# Patient Record
Sex: Female | Born: 1985 | Race: Black or African American | Hispanic: No | Marital: Married
Health system: Southern US, Community
[De-identification: ages and names within clinical notes are randomized; demographics above are authoritative.]

## PROBLEM LIST (undated history)

## (undated) DIAGNOSIS — D649 Anemia, unspecified: Secondary | ICD-10-CM

## (undated) DIAGNOSIS — D219 Benign neoplasm of connective and other soft tissue, unspecified: Secondary | ICD-10-CM

## (undated) DIAGNOSIS — J189 Pneumonia, unspecified organism: Secondary | ICD-10-CM

## (undated) DIAGNOSIS — B977 Papillomavirus as the cause of diseases classified elsewhere: Secondary | ICD-10-CM

## (undated) DIAGNOSIS — Z9889 Other specified postprocedural states: Secondary | ICD-10-CM

## (undated) DIAGNOSIS — R51 Headache: Secondary | ICD-10-CM

## (undated) DIAGNOSIS — L309 Dermatitis, unspecified: Secondary | ICD-10-CM

## (undated) DIAGNOSIS — R519 Headache, unspecified: Secondary | ICD-10-CM

## (undated) DIAGNOSIS — A599 Trichomoniasis, unspecified: Secondary | ICD-10-CM

## (undated) DIAGNOSIS — J45909 Unspecified asthma, uncomplicated: Secondary | ICD-10-CM

## (undated) DIAGNOSIS — N83209 Unspecified ovarian cyst, unspecified side: Secondary | ICD-10-CM

## (undated) HISTORY — DX: Headache: R51

## (undated) HISTORY — PX: LAPAROSCOPIC SALPINGO OOPHERECTOMY: SHX5927

## (undated) HISTORY — PX: DILATION AND CURETTAGE OF UTERUS: SHX78

## (undated) HISTORY — DX: Benign neoplasm of connective and other soft tissue, unspecified: D21.9

## (undated) HISTORY — PX: MYOMECTOMY: SHX85

## (undated) HISTORY — DX: Pneumonia, unspecified organism: J18.9

## (undated) HISTORY — DX: Headache, unspecified: R51.9

---
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
## 2003-05-13 ENCOUNTER — Encounter: Admission: RE | Admit: 2003-05-13 | Discharge: 2003-05-13 | Payer: Self-pay | Admitting: Family Medicine

## 2003-05-16 ENCOUNTER — Emergency Department (HOSPITAL_COMMUNITY): Admission: AD | Admit: 2003-05-16 | Discharge: 2003-05-17 | Payer: Self-pay | Admitting: Emergency Medicine

## 2004-04-15 ENCOUNTER — Emergency Department (HOSPITAL_COMMUNITY): Admission: EM | Admit: 2004-04-15 | Discharge: 2004-04-15 | Payer: Self-pay | Admitting: Family Medicine

## 2005-07-05 ENCOUNTER — Other Ambulatory Visit: Admission: RE | Admit: 2005-07-05 | Discharge: 2005-07-05 | Payer: Self-pay | Admitting: Family Medicine

## 2006-10-27 ENCOUNTER — Emergency Department (HOSPITAL_COMMUNITY): Admission: EM | Admit: 2006-10-27 | Discharge: 2006-10-27 | Payer: Self-pay | Admitting: Family Medicine

## 2009-06-10 ENCOUNTER — Other Ambulatory Visit: Admission: RE | Admit: 2009-06-10 | Discharge: 2009-06-10 | Payer: Self-pay | Admitting: Gynecology

## 2009-06-10 ENCOUNTER — Ambulatory Visit: Payer: Self-pay | Admitting: Women's Health

## 2011-01-15 LAB — STREP A DNA PROBE: Group A Strep Probe: NEGATIVE

## 2011-01-15 LAB — POCT RAPID STREP A: Streptococcus, Group A Screen (Direct): NEGATIVE

## 2012-07-01 ENCOUNTER — Inpatient Hospital Stay (HOSPITAL_COMMUNITY): Payer: BC Managed Care – PPO

## 2012-07-01 ENCOUNTER — Inpatient Hospital Stay (HOSPITAL_COMMUNITY)
Admission: AD | Admit: 2012-07-01 | Discharge: 2012-07-01 | Disposition: A | Discharge status: Home / Self Care | Payer: BC Managed Care – PPO | Source: Ambulatory Visit | Attending: Obstetrics & Gynecology | Admitting: Obstetrics & Gynecology

## 2012-07-01 ENCOUNTER — Other Ambulatory Visit (HOSPITAL_COMMUNITY)
Admission: RE | Admit: 2012-07-01 | Discharge: 2012-07-01 | Disposition: A | Discharge status: Home / Self Care | Payer: Self-pay | Source: Ambulatory Visit | Attending: Family Medicine | Admitting: Family Medicine

## 2012-07-01 ENCOUNTER — Emergency Department (INDEPENDENT_AMBULATORY_CARE_PROVIDER_SITE_OTHER)
Admission: EM | Admit: 2012-07-01 | Discharge: 2012-07-01 | Disposition: A | Discharge status: Home / Self Care | Payer: Self-pay | Source: Home / Self Care | Attending: Family Medicine | Admitting: Family Medicine

## 2012-07-01 ENCOUNTER — Encounter (HOSPITAL_COMMUNITY): Payer: Self-pay | Admitting: Emergency Medicine

## 2012-07-01 ENCOUNTER — Encounter (HOSPITAL_COMMUNITY): Payer: Self-pay

## 2012-07-01 DIAGNOSIS — R1900 Intra-abdominal and pelvic swelling, mass and lump, unspecified site: Secondary | ICD-10-CM | POA: Insufficient documentation

## 2012-07-01 DIAGNOSIS — N949 Unspecified condition associated with female genital organs and menstrual cycle: Secondary | ICD-10-CM | POA: Insufficient documentation

## 2012-07-01 DIAGNOSIS — R1903 Right lower quadrant abdominal swelling, mass and lump: Secondary | ICD-10-CM

## 2012-07-01 DIAGNOSIS — Z113 Encounter for screening for infections with a predominantly sexual mode of transmission: Secondary | ICD-10-CM | POA: Insufficient documentation

## 2012-07-01 DIAGNOSIS — R109 Unspecified abdominal pain: Secondary | ICD-10-CM | POA: Insufficient documentation

## 2012-07-01 DIAGNOSIS — N760 Acute vaginitis: Secondary | ICD-10-CM | POA: Insufficient documentation

## 2012-07-01 HISTORY — DX: Unspecified asthma, uncomplicated: J45.909

## 2012-07-01 HISTORY — DX: Unspecified ovarian cyst, unspecified side: N83.209

## 2012-07-01 HISTORY — DX: Benign neoplasm of connective and other soft tissue, unspecified: D21.9

## 2012-07-01 LAB — COMPREHENSIVE METABOLIC PANEL
ALT: 9 U/L (ref 0–35)
AST: 15 U/L (ref 0–37)
Albumin: 3.6 g/dL (ref 3.5–5.2)
Calcium: 9.7 mg/dL (ref 8.4–10.5)
Creatinine, Ser: 0.81 mg/dL (ref 0.50–1.10)
Sodium: 138 mEq/L (ref 135–145)
Total Protein: 7.7 g/dL (ref 6.0–8.3)

## 2012-07-01 LAB — POCT URINALYSIS DIP (DEVICE)
Bilirubin Urine: NEGATIVE
Glucose, UA: NEGATIVE mg/dL
Nitrite: NEGATIVE
Urobilinogen, UA: 0.2 mg/dL (ref 0.0–1.0)

## 2012-07-01 LAB — CBC
HCT: 34.9 % — ABNORMAL LOW (ref 36.0–46.0)
Hemoglobin: 11.8 g/dL — ABNORMAL LOW (ref 12.0–15.0)
MCH: 28.7 pg (ref 26.0–34.0)
MCV: 84.9 fL (ref 78.0–100.0)
RBC: 4.11 MIL/uL (ref 3.87–5.11)

## 2012-07-01 LAB — POCT PREGNANCY, URINE: Preg Test, Ur: NEGATIVE

## 2012-07-01 MED ORDER — SODIUM CHLORIDE 0.9 % IJ SOLN
INTRAMUSCULAR | Status: AC
  Filled 2012-07-01: qty 6

## 2012-07-01 MED ORDER — IOHEXOL 300 MG/ML  SOLN
50.0000 mL | INTRAMUSCULAR | Status: AC
  Administered 2012-07-01: 50 mL via ORAL

## 2012-07-01 MED ORDER — IOHEXOL 300 MG/ML  SOLN
100.0000 mL | Freq: Once | INTRAMUSCULAR | Status: AC | PRN
  Administered 2012-07-01: 100 mL via INTRAVENOUS

## 2012-07-01 NOTE — MAU Provider Note (Signed)
History     CSN: [id number]  Arrival date and time: 07/01/12 1617   First Provider Initiated Contact with Patient 07/01/12 1654      Chief Complaint  Patient presents with  . Pelvic Pain   HPI [name] is a 27 y.o. G2P0020 who was sent to MAU today from [facility] urgent care with complaint of abdominal pain and mass. The patient was seen at urgent care and had a pelvic exam with cultures. UPT was negative. She has a large tender mass across her lower abdomen. Patient denies vaginal bleeding, vaginal discharge, N/V or fever. She has had regular periods. LMP was 06/16/12. She denies abnormal weight loss, night sweats.   OB History   Grav Para Term Preterm Abortions TAB SAB Ect Mult Living   2    2 1 1          Past Medical History  Diagnosis Date  . Asthma     Past Surgical History  Procedure Laterality Date  . Dilation and curettage of uterus      History reviewed. No pertinent family history.  History  Substance Use Topics  . Smoking status: Never Smoker   . Smokeless tobacco: Not on file  . Alcohol Use: No    Allergies: No Known Allergies  No prescriptions prior to admission    Review of Systems  Constitutional: Negative for fever, chills, weight loss, malaise/fatigue and diaphoresis.  Gastrointestinal: Positive for abdominal pain. Negative for nausea and vomiting.  Genitourinary:       Neg - vaginal bleeding Neg - vaginal discharge  Neurological: Negative for dizziness, loss of consciousness and weakness.   Physical Exam   Blood pressure 120/72, pulse 84, temperature 98.5 F (36.9 C), temperature source Oral, resp. rate 16, height 5' (1.524 m), weight 131 lb 6 oz (59.591 kg), last menstrual period 06/16/2012.  Physical Exam  Constitutional: She is oriented to person, place, and time. She appears well-developed and well-nourished. No distress.  HENT:  Head: Normocephalic and atraumatic.  Cardiovascular: Normal rate, regular rhythm and normal heart  sounds.   Respiratory: Effort normal and breath sounds normal. No respiratory distress.  GI: Bowel sounds are normal. She exhibits distension and mass (Large firm mass palpable across the lower abdomen). There is tenderness (mild tenderness to palpation of the LLQ). There is no rebound and no guarding.  Neurological: She is alert and oriented to person, place, and time.  Skin: Skin is warm and dry. No erythema.  Psychiatric: She has a normal mood and affect.   Results for orders placed during the hospital encounter of 07/01/12 (from the past 24 hour(s))  POCT URINALYSIS DIP (DEVICE)     Status: None   Collection Time    07/01/12  2:47 PM      Result Value Range   Glucose, UA NEGATIVE  NEGATIVE mg/dL   Bilirubin Urine NEGATIVE  NEGATIVE   Ketones, ur NEGATIVE  NEGATIVE mg/dL   Specific Gravity, Urine 1.015  1.005 - 1.030   Hgb urine dipstick NEGATIVE  NEGATIVE   pH 8.0  5.0 - 8.0   Protein, ur NEGATIVE  NEGATIVE mg/dL   Urobilinogen, UA 0.2  0.0 - 1.0 mg/dL   Nitrite NEGATIVE  NEGATIVE   Leukocytes, UA NEGATIVE  NEGATIVE  POCT PREGNANCY, URINE     Status: None   Collection Time    07/01/12  2:58 PM      Result Value Range   Preg Test, Ur NEGATIVE  NEGATIVE  MAU Course  Procedures None Clinical Data: Pelvic mass  CT ABDOMEN AND PELVIS WITH CONTRAST  Technique: Multidetector CT imaging of the abdomen and pelvis was  performed following the standard protocol during bolus  administration of intravenous contrast.  Contrast: OMNIPAQUE IOHEXOL 300 MG/ML SOLN  Comparison: None.  Findings: The liver, spleen, pancreas, adrenal glands, kidneys are  within normal limits.  There is a large 9.7 x 15.1 x 12.7 cm complex solid and cystic mass  emanating from the right adnexa. There are no definite fatty  elements or calcified elements.  2.1 x 3.2 cm avidly enhancing and heterogeneous pedunculated  fibroid from the left side of the fundus is noted.  Irregular heterogeneous complex  cystic lesion within the left  adnexa measures 2.0 cm and likely represents an involuting  hemorrhagic cyst.  Trace free fluid is present in the pelvis.  No finding to suggest peritoneal carcinomatosis. There is no free  fluid in the abdomen.  There is no obvious abnormal adenopathy.  IMPRESSION:  Large complex solid and cystic mass emanating from the right adnexa  worrisome for ovarian neoplasm.  Pedunculated fundal fibroid.  Small complex cystic lesion in the left ovary as described.   MDM Discussed [place] results with Dr. Marice Potter. She would like CT scan today and CA-125. Patient will need to be referred to clinic for follow-up and surgical consult.   Assessment and Plan  2000 - Patient in Radiology. Care turned over to Nolene Bernheim, NP Assessment Right adnexal abdominal mass  Plan GYN clinic to call client and schedule follow up.  Message sent to clinic. Call the clinic if you do not get a call in 2 days. Freddi Starr, PA-C  07/01/2012, 5:02 PM

## 2012-07-01 NOTE — ED Provider Notes (Signed)
History     CSN: [id number]  Arrival date & time 07/01/12  1213   First MD Initiated Contact with Patient 07/01/12 1254      Chief Complaint  Patient presents with  . Abdominal Pain    (Consider location/radiation/quality/duration/timing/severity/associated sxs/prior treatment) Patient is a 27 y.o. female presenting with abdominal pain. The history is provided by the patient.  Abdominal Pain Pain location:  Suprapubic Pain quality: aching   Pain radiates to:  Does not radiate Pain severity:  Moderate Onset quality:  Gradual Timing:  Constant Progression:  Worsening Relieved by:  Nothing Worsened by:  Nothing tried   History reviewed. No pertinent past medical history.  History reviewed. No pertinent past surgical history.  History reviewed. No pertinent family history.  History  Substance Use Topics  . Smoking status: Never Smoker   . Smokeless tobacco: Not on file  . Alcohol Use: No    OB History   Grav Para Term Preterm Abortions TAB SAB Ect Mult Living                  Review of Systems  Gastrointestinal: Positive for abdominal pain.    Allergies  Review of patient's allergies indicates no known allergies.  Home Medications  No current outpatient prescriptions on file.  BP 109/60  Pulse 71  Temp(Src) 98.1 F (36.7 C) (Oral)  Resp 16  SpO2 100%  LMP 06/16/2012  Physical Exam  ED Course  Procedures (including critical care time)  Labs Reviewed  POCT URINALYSIS DIP (DEVICE)  POCT PREGNANCY, URINE  CERVICOVAGINAL ANCILLARY ONLY   No results found.   1. Pelvic mass       MDM  Pt sent to [facility] hospital for evaluation at MAU for pelvic mass        Elson Areas, PA-C 07/01/12 2011

## 2012-07-01 NOTE — ED Notes (Signed)
Pt c/o lower abdominal pain with a small knot in lower abdomin x 1 month. No n/v, diarrhea, constipation, urinary retention. Pt states she took a gas pill with mild relief. Pt reports it uncomfortable to touch that area., but not painful. Feels bloated. Patient is alert and oriented.

## 2012-07-01 NOTE — MAU Note (Signed)
Patient drove herself from [facility] for further evaluation. She went in with c/o pelvic pressure that intensifies with movement or touch. She denies vaginal bleeding or abnormal vaginal discharge. She denies any pain while sitting on stretcher. She is aware that her pregnancy test was negative.

## 2012-07-02 LAB — CA 125: CA 125: 14.4 U/mL (ref 0.0–30.2)

## 2012-07-03 ENCOUNTER — Telehealth (HOSPITAL_COMMUNITY): Payer: Self-pay | Admitting: *Deleted

## 2012-07-03 MED ORDER — METRONIDAZOLE 500 MG PO TABS: 500.0000 mg | ORAL_TABLET | Freq: Two times a day (BID) | ORAL | Status: DC

## 2012-07-03 NOTE — ED Notes (Signed)
GC/Chlamydia neg.,  Affirm: Gardnerella/Trich pos. Candida neg.  Pt. was sent to [facility] for evaluation of pelvic mass but not treatment from there noted.  Message sent to [employer] PA.

## 2012-07-03 NOTE — ED Notes (Signed)
Langston Masker PA e-prescribed Flagyl to [facility] on [address]. I called pt. and left a message to call. Vassie Moselle 07/03/2012

## 2012-07-03 NOTE — ED Provider Notes (Signed)
Medical screening examination/treatment/procedure(s) were performed by resident physician or non-physician practitioner and as supervising physician I was immediately available for consultation/collaboration.   Sicily Zaragoza DOUGLAS MD.   Curt Oatis D Rachele Lamaster, MD 07/03/12 1954 

## 2012-07-04 ENCOUNTER — Telehealth (HOSPITAL_COMMUNITY): Payer: Self-pay | Admitting: *Deleted

## 2012-07-04 NOTE — ED Notes (Signed)
Pt. called back.  Pt. verified x 2 and given results.  Pt. told she needs Flagyl for Gardnerella and Trich.  Pt. told it was sent to the [facility] on [address]   Pt. instructed to no alcohol while taking this medication.  You need to notify your partner to be treated with Flagyl, no sex until you have finished your medication and your partner has been treated and to practice safe sex. Pt. voiced understanding. Vassie Moselle 07/04/2012

## 2012-07-04 NOTE — ED Notes (Signed)
Pt's VM is full. Left message with pt.'s mother for pt. to call me. Dawn Mayo 07/04/2012

## 2012-07-24 ENCOUNTER — Encounter: Payer: Self-pay | Admitting: Family Medicine

## 2012-07-24 ENCOUNTER — Ambulatory Visit (INDEPENDENT_AMBULATORY_CARE_PROVIDER_SITE_OTHER): Payer: Self-pay | Admitting: Family Medicine

## 2012-07-24 VITALS — BP 137/102 | HR 102 | Temp 98.9°F | Ht 60.0 in | Wt 131.9 lb

## 2012-07-24 DIAGNOSIS — N83209 Unspecified ovarian cyst, unspecified side: Secondary | ICD-10-CM

## 2012-07-24 NOTE — Patient Instructions (Addendum)
Ovarian Cyst The ovaries are small organs that are on each side of the uterus. The ovaries are the organs that produce the female hormones, estrogen and progesterone. An ovarian cyst is a sac filled with fluid that can vary in its size. It is normal for a small cyst to form in women who are in the childbearing age and who have menstrual periods. This type of cyst is called a follicle cyst that becomes an ovulation cyst (corpus luteum cyst) after it produces the women's egg. It later goes away on its own if the woman does not become pregnant. There are other kinds of ovarian cysts that may cause problems and may need to be treated. The most serious problem is a cyst with cancer. It should be noted that menopausal women who have an ovarian cyst are at a higher risk of it being a cancer cyst. They should be evaluated very quickly, thoroughly and followed closely. This is especially true in menopausal women because of the high rate of ovarian cancer in women in menopause. CAUSES AND TYPES OF OVARIAN CYSTS:  FUNCTIONAL CYST: The follicle/corpus luteum cyst is a functional cyst that occurs every month during ovulation with the menstrual cycle. They go away with the next menstrual cycle if the woman does not get pregnant. Usually, there are no symptoms with a functional cyst.  ENDOMETRIOMA CYST: This cyst develops from the lining of the uterus tissue. This cyst gets in or on the ovary. It grows every month from the bleeding during the menstrual period. It is also called a "chocolate cyst" because it becomes filled with blood that turns brown. This cyst can cause pain in the lower abdomen during intercourse and with your menstrual period.  CYSTADENOMA CYST: This cyst develops from the cells on the outside of the ovary. They usually are not cancerous. They can get very big and cause lower abdomen pain and pain with intercourse. This type of cyst can twist on itself, cut off its blood supply and cause severe pain. It  also can easily rupture and cause a lot of pain.  DERMOID CYST: This type of cyst is sometimes found in both ovaries. They are found to have different kinds of body tissue in the cyst. The tissue includes skin, teeth, hair, and/or cartilage. They usually do not have symptoms unless they get very big. Dermoid cysts are rarely cancerous.  POLYCYSTIC OVARY: This is a rare condition with hormone problems that produces many small cysts on both ovaries. The cysts are follicle-like cysts that never produce an egg and become a corpus luteum. It can cause an increase in body weight, infertility, acne, increase in body and facial hair and lack of menstrual periods or rare menstrual periods. Many women with this problem develop type 2 diabetes. The exact cause of this problem is unknown. A polycystic ovary is rarely cancerous.  THECA LUTEIN CYST: Occurs when too much hormone (human chorionic gonadotropin) is produced and over-stimulates the ovaries to produce an egg. They are frequently seen when doctors stimulate the ovaries for invitro-fertilization (test tube babies).  LUTEOMA CYST: This cyst is seen during pregnancy. Rarely it can cause an obstruction to the birth canal during labor and delivery. They usually go away after delivery. SYMPTOMS   Pelvic pain or pressure.  Pain during sexual intercourse.  Increasing girth (swelling) of the abdomen.  Abnormal menstrual periods.  Increasing pain with menstrual periods.  You stop having menstrual periods and you are not pregnant. DIAGNOSIS  The diagnosis can   be made during:  Routine or annual pelvic examination (common).  Ultrasound.  X-ray of the pelvis.  CT Scan.  MRI.  Blood tests. TREATMENT   Treatment may only be to follow the cyst monthly for 2 to 3 months with your caregiver. Many go away on their own, especially functional cysts.  May be aspirated (drained) with a long needle with ultrasound, or by laparoscopy (inserting a tube into  the pelvis through a small incision).  The whole cyst can be removed by laparoscopy.  Sometimes the cyst may need to be removed through an incision in the lower abdomen.  Hormone treatment is sometimes used to help dissolve certain cysts.  Birth control pills are sometimes used to help dissolve certain cysts. HOME CARE INSTRUCTIONS  Follow your caregiver's advice regarding:  Medicine.  Follow up visits to evaluate and treat the cyst.  You may need to come back or make an appointment with another caregiver, to find the exact cause of your cyst, if your caregiver is not a gynecologist.  Get your yearly and recommended pelvic examinations and Pap tests.  Let your caregiver know if you have had an ovarian cyst in the past. SEEK MEDICAL CARE IF:   Your periods are late, irregular, they stop, or are painful.  Your stomach (abdomen) or pelvic pain does not go away.  Your stomach becomes larger or swollen.  You have pressure on your bladder or trouble emptying your bladder completely.  You have painful sexual intercourse.  You have feelings of fullness, pressure, or discomfort in your stomach.  You lose weight for no apparent reason.  You feel generally ill.  You become constipated.  You lose your appetite.  You develop acne.  You have an increase in body and facial hair.  You are gaining weight, without changing your exercise and eating habits.  You think you are pregnant. SEEK IMMEDIATE MEDICAL CARE IF:   You have increasing abdominal pain.  You feel sick to your stomach (nausea) and/or vomit.  You develop a fever that comes on suddenly.  You develop abdominal pain during a bowel movement.  Your menstrual periods become heavier than usual. Document Released: 03/19/2005 Document Revised: 06/11/2011 Document Reviewed: 01/20/2009 Exit[employer] Patient Information 2013 [employer].  

## 2012-07-24 NOTE — Progress Notes (Signed)
  Subjective:    Patient ID: Dawn Mayo, female    DOB: Feb 17, 1986, 27 y.o.   MRN: [id number]  HPI  Ptl is new today.  [id number] with 4 month h/o hard growth in stomach.  Pt. Noted enlarging area in December.  She has noted increased growth, increasing girth, and discomfort.  She was seen in urgent care and ED and found to have a large ovarian mass on right measuring 11.5 x 9.0 x 10.4.  The left ovary also has a similar appearance but a smaller lesion by both Ct and pelvic sono.  She has begun to have pain, especially on the left.  She was noted to have bilateral pelvic masses but the largest is on the right. Her CA-125 was negative.  X-rays were reviewed with Radiology today and they state that images are very concerning for malignancy.  Past Medical History  Diagnosis Date  . Asthma   . Fibroids   . Ovarian cyst    Past Surgical History  Procedure Laterality Date  . Dilation and curettage of uterus     Current Outpatient Prescriptions on File Prior to Visit  Medication Sig Dispense Refill  . metroNIDAZOLE (FLAGYL) 500 MG tablet Take 1 tablet (500 mg total) by mouth 2 (two) times daily.  14 tablet  0   No current facility-administered medications on file prior to visit.   No Known Allergies History reviewed. No pertinent family history. History   Social History  . Marital Status: Single    Spouse Name: N/A    Number of Children: N/A  . Years of Education: N/A   Occupational History  . Not on file.   Social History Main Topics  . Smoking status: Never Smoker   . Smokeless tobacco: Not on file  . Alcohol Use: No  . Drug Use: No  . Sexually Active: Not Currently    Birth Control/ Protection: None   Other Topics Concern  . Not on file   Social History Narrative  . No narrative on file    Review of Systems  Constitutional: Positive for fatigue and unexpected weight change (weight gain). Negative for fever and chills.  Respiratory: Negative for cough and shortness of  breath.   Cardiovascular: Negative for palpitations and leg swelling.  Gastrointestinal: Positive for abdominal pain. Negative for nausea and vomiting.       + bloating.  Endocrine: Negative for polyuria.  Genitourinary: Negative for vaginal bleeding and menstrual problem.  Musculoskeletal: Negative for arthralgias.  Skin: Negative for rash.  Neurological: Negative for headaches.  Psychiatric/Behavioral: Negative for confusion.       Objective:   Physical Exam  Vitals reviewed. Constitutional: She appears well-developed and well-nourished.  HENT:  Head: Normocephalic and atraumatic.  Eyes: No scleral icterus.  Cardiovascular: Normal rate and regular rhythm.   Pulmonary/Chest: Effort normal and breath sounds normal.  Abdominal: She exhibits distension and mass (lower abdomen measuring 25 x 22 cm.). There is tenderness.          Assessment & Plan:

## 2012-07-25 NOTE — Assessment & Plan Note (Addendum)
Features are worrisome for malignancy, given size, rapidity of growth, review with radiology and bilaterality.  Given all of this, feel pt. Would best be served seeing GYN/Oncology.  I have contacted them and made an appointment for her on April 29,2014 at 2:30.

## 2012-07-28 ENCOUNTER — Telehealth: Payer: Self-pay | Admitting: Gynecologic Oncology

## 2012-07-28 NOTE — Telephone Encounter (Signed)
Consult Note: Gyn-Onc  Consult was requested by Dr. Marland Kitchen for the evaluation of Dawn Mayo 27 y.o. female  CC: Pelvic mass   Assessment/Plan:  [name]  is a 27 y.o.  year old  With a 15.1 cm complex right adnexal mass and normal CA125    HPI:   07/01/12 CT Abdomen and pelvis Findings: The liver, spleen, pancreas, adrenal glands, kidneys are within normal limits. There is a large 9.7 x 15.1 x 12.7 cm complex solid and cystic mass emanating from the right adnexa. There are no definite fatty elements or calcified elements. 2.1 x 3.2 cm avidly enhancing and heterogeneous pedunculated fibroid from the left side of the fundus is noted. Irregular heterogeneous complex cystic lesion within the left adnexa measures 2.0 cm and likely represents an involuting hemorrhagic cyst. Trace free fluid is present in the pelvis.  07/01/12 Pelvic ultrasound The right complex adnexal mass demonstrates a solid component measuring 5.0 x 5.0 by 5.2 cm which contains some internal flow with color Doppler exam as well as a slightly thickened septation measuring 3.5 mm. More normal appearing ovarian tissue is seen at  this periphery of this lesion (images 84 and 85) suggesting this is ovarian in etiology. This could represent a dermoid but the appearance is not typical and the solid component would be considered a worrisome feature. A malignant neoplastic process such as a serous cystadenocarcinoma or borderline tumor cannot be excluded.  Lab Results  Component Value Date   CA125 14.4 07/01/2012     Current Meds:  Outpatient Encounter Prescriptions as of 07/28/2012  Medication Sig Dispense Refill  . metroNIDAZOLE (FLAGYL) 500 MG tablet Take 1 tablet (500 mg total) by mouth 2 (two) times daily.  14 tablet  0   No facility-administered encounter medications on file as of 07/28/2012.    Allergy: No Known Allergies  Social Hx:   History   Social History  . Marital Status: Single    Spouse Name: N/A     Number of Children: N/A  . Years of Education: N/A   Occupational History  . Not on file.   Social History Main Topics  . Smoking status: Never Smoker   . Smokeless tobacco: Not on file  . Alcohol Use: No  . Drug Use: No  . Sexually Active: Not Currently    Birth Control/ Protection: None   Other Topics Concern  . Not on file   Social History Narrative  . No narrative on file    Past Surgical Hx:  Past Surgical History  Procedure Laterality Date  . Dilation and curettage of uterus      Past Medical Hx:  Past Medical History  Diagnosis Date  . Asthma   . Fibroids   . Ovarian cyst     Past Gynecological History:  ** Patient's last menstrual period was 07/11/2012.  Family Hx: No family history on file.  Review of Systems:  Constitutional  Feels well,  ** Skin/Breast  No rash, sores, jaundice, itching, dryness Cardiovascular  No chest pain, shortness of breath, or edema  Pulmonary  No cough or wheeze.  Gastro Intestinal  No nausea, vomitting, or diarrhoea. No bright red blood per rectum, no abdominal pain, change in bowel movement, or constipation.  Genito Urinary  No frequency, urgency, dysuria, ** Musculo Skeletal  No myalgia, arthralgia, joint swelling or pain  Neurologic  No weakness, numbness, change in gait,  Psychology  No depression, anxiety, insomnia.   Vitals:  @v @  Physical  Exam: WD in NAD Neck  Supple NROM, without any enlargements.  Lymph Node Survey No cervical supraclavicular or inguinal adenopathy Cardiovascular  Pulse normal rate, regularity and rhythm. S1 and S2 normal.  Lungs  Clear to auscultation bilateraly, without wheezes/crackles/rhonchi. Good air movement.  Skin  No rash/lesions/breakdown  Psychiatry  Alert and oriented to person, place, and time  Abdomen  Normoactive bowel sounds, abdomen soft, non-tender and obese. Surgical  sites intact without evidence of hernia.  Back No CVA tenderness Genito Urinary   Vulva/vagina: Normal external female genitalia.  No lesions. No discharge or bleeding.  Bladder/urethra:  No lesions or masses  Vagina: **  Cervix: Normal appearing, no lesions.  Uterus: Small, mobile, no parametrial involvement or nodularity.  Adnexa: No palpable masses. Rectal  Good tone, no masses no cul de sac nodularity.  Extremities  No bilateral cyanosis, clubbing or edema.   [name], MD, PhD 07/28/2012, 9:51 PM

## 2012-07-29 ENCOUNTER — Encounter: Payer: Self-pay | Admitting: Gynecologic Oncology

## 2012-07-29 ENCOUNTER — Ambulatory Visit: Payer: Self-pay | Attending: Gynecologic Oncology | Admitting: Gynecologic Oncology

## 2012-07-29 VITALS — BP 100/64 | HR 64 | Temp 98.3°F | Resp 14 | Ht 61.46 in | Wt 130.6 lb

## 2012-07-29 DIAGNOSIS — N838 Other noninflammatory disorders of ovary, fallopian tube and broad ligament: Secondary | ICD-10-CM

## 2012-07-29 DIAGNOSIS — N9489 Other specified conditions associated with female genital organs and menstrual cycle: Secondary | ICD-10-CM | POA: Insufficient documentation

## 2012-07-29 NOTE — Patient Instructions (Addendum)
I have reviewed my recommendation for the planned surgery of Exploratory laparotomy, bilateral ovarian cystectomy.  If malignancy is encountered the procedure could be extended to total abdominal hysterectomy, bilateral salpingo-oopherectomy, omentectomy, lymph node dissection, tumor debulking, possible appendectomy, possible bowel resection, possible colostomy. Risks of infection, bleeding, damage to nearby organs including bowel, bladder, vessels, and ureters reviewed.   We understand that ovarian preservation and fertility  is a priority. We will contact you regarding a surgery date.    Thank you very much Dawn Mayo for allowing me to provide care for you today.  I appreciate your confidence in choosing our Gynecologic Oncology team.  If you have any questions about your visit today please call our office and we will get back to you as soon as possible.  Maryclare Labrador. Mysty Kielty MD., PhD Gynecologic Oncology

## 2012-07-29 NOTE — Progress Notes (Signed)
Consult Note: Gyn-Onc  Consult was requested by Dr. Pratt for the evaluation of Dawn Mayo 27 y.o. female  CC: Pelvic mass   Assessment/Plan:  Dawn Mayo  is a 27 y.o.  year old  with a 15.1 cm complex right adnexal mass and normal CA125.  The left adnexa suspicious for an abnormal appearing ovarian cysts. [name] strongly desires to follow consult speech and her PT any with her yet unborn children.  That is she strongly desires preservation of fertility.   She is aware that the index of suspicion is low that this is a malignancy. I've also counseled her that there are ovarian cancers for which ovarian preservation if possible.  In the event that ovarian preservation is not possible she would strongly desire uterine preservation.  She was counseled of the operative procedure most likely would be bilateral ovarian cystectomy possible omentectomy lymph node dissection debulking or other indicated procedures. The risks discussed were inclusive of infection bleeding damage to surrounding structures prolonged hospitalization and reoperation.  We will followup with [name] regarding the surgery date. She is aware that the surgeon will be Dr. Paula Gehrig. I've also asked her to substitute the metal periclitoral ring for a plastic device.   HPI:   07/01/12 CT Abdomen and pelvis Findings: The liver, spleen, pancreas, adrenal glands, kidneys are within normal limits. There is a large 9.7 x 15.1 x 12.7 cm complex solid and cystic mass emanating from the right adnexa. There are no definite fatty elements or calcified elements. 2.1 x 3.2 cm avidly enhancing and heterogeneous pedunculated fibroid from the left side of the fundus is noted. Irregular heterogeneous complex cystic lesion within the left adnexa measures 2.0 cm and likely represents an involuting hemorrhagic cyst. Trace free fluid is present in the pelvis.  07/01/12 Pelvic ultrasound The right complex adnexal mass demonstrates a  solid component measuring 5.0 x 5.0 by 5.2 cm which contains some internal flow with color Doppler exam as well as a slightly thickened septation measuring 3.5 mm. More normal appearing ovarian tissue is seen at  this periphery of this lesion  suggesting this is ovarian in etiology. This could represent a dermoid but the appearance is not typical and the solid component would be considered a worrisome feature. A malignant neoplastic process such as a serous cystadenocarcinoma or borderline tumor cannot be excluded.  Reports weight gain and increasing abdominal girth.  No change in bowel or bladder habits.      Lab Results  Component Value Date   CA125 14.4 07/01/2012     Current Meds:  Outpatient Encounter Prescriptions as of 07/29/2012  Medication Sig Dispense Refill  . metroNIDAZOLE (FLAGYL) 500 MG tablet Take 1 tablet (500 mg total) by mouth 2 (two) times daily.  14 tablet  0   No facility-administered encounter medications on file as of 07/29/2012.    Allergy: No Known Allergies  Social Hx:   History   Social History  . Marital Status: Single    Spouse Name: N/A    Number of Children: N/A  . Years of Education: N/A   Occupational History  . Not on file.   Social History Main Topics  . Smoking status: Never Smoker   . Smokeless tobacco: Not on file  . Alcohol Use: No  . Drug Use: No  . Sexually Active: Not Currently    Birth Control/ Protection: None   Other Topics Concern  . Not on file   Social History Narrative  . No   narrative on file  [name]  is pending [facility]. Final examinations are scheduled for next week.  Her appointment is that presently of a hairstylist.  Past Surgical Hx:  Past Surgical History  Procedure Laterality Date  . Dilation and curettage of uterus      Past Medical Hx:  Past Medical History  Diagnosis Date  . Asthma   . Fibroids   . Ovarian cyst     Past Gynecological History:  G2P0.  Menarche 11 regular menses.  Patient's last menstrual period was 07/11/2012.  Family Hx: No family history on file.  Review of Systems:  Constitutional  Feels well,  Reports weight gain. Cardiovascular  No chest pain, shortness of breath, or edema  Pulmonary  No cough or wheeze.  Gastro Intestinal  No nausea, vomitting, or diarrhoea. No bright red blood per rectum, infrequent abdominal pain, no  change in bowel movement, or constipation. Reports increasing abdominal girth. Genito Urinary  No frequency, urgency, dysuria, no vaginal bleeding or discharge Musculo Skeletal  No myalgia, arthralgia, joint swelling or pain  Neurologic  No weakness, numbness, change in gait,  Psychology  No depression, anxiety, insomnia.   Vitals:  BP 100/64  Pulse 64  Temp(Src) 98.3 F (36.8 C) (Oral)  Resp 14  Ht 5' 1.46" (1.561 m)  Wt 130 lb 9.6 oz (59.24 kg)  BMI 24.31 kg/m2  LMP 07/11/2012  Physical Exam: WD in NAD Neck  Supple NROM, without any enlargements.  Lymph Node Survey No cervical supraclavicular or inguinal adenopathy Cardiovascular  Pulse normal rate, regularity and rhythm. S1 and S2 normal.  Lungs  Clear to auscultation bilateraly, without wheezes/crackles/rhonchi. Good air movement.  Psychiatry  Alert and oriented to person, place, and time  Abdomen  Normoactive bowel sounds, abdomen soft, non-tender mobile pelvic mass.  Back No CVA tenderness Genito Urinary  Vulva/vagina: Normal external female genitalia. Periclitoral metal ring  No lesions. No discharge or bleeding.  Bladder/urethra:  No lesions or masses  Vagina: Well estrogenized Cervix: Normal appearing, no lesions.  Uterus: Small, mobile, no parametrial involvement or nodularity.  Adnexa: large mobile pelvic mass.  No cul de sac nodularity Rectal  Good tone, no masses no cul de sac nodularity.  Extremities  No bilateral cyanosis, clubbing or edema.   Adel Neyer, MD, PhD 07/29/2012, 3:14 PM   

## 2012-08-07 ENCOUNTER — Encounter (HOSPITAL_COMMUNITY): Payer: Self-pay | Admitting: Pharmacy Technician

## 2012-08-14 ENCOUNTER — Other Ambulatory Visit (HOSPITAL_COMMUNITY): Payer: Self-pay | Admitting: *Deleted

## 2012-08-15 ENCOUNTER — Encounter (HOSPITAL_COMMUNITY)
Admission: RE | Admit: 2012-08-15 | Discharge: 2012-08-15 | Disposition: A | Discharge status: Home / Self Care | Payer: BC Managed Care – PPO | Source: Ambulatory Visit | Attending: Gynecologic Oncology | Admitting: Gynecologic Oncology

## 2012-08-15 ENCOUNTER — Encounter (HOSPITAL_COMMUNITY): Payer: Self-pay

## 2012-08-15 DIAGNOSIS — Z0183 Encounter for blood typing: Secondary | ICD-10-CM | POA: Insufficient documentation

## 2012-08-15 DIAGNOSIS — N83209 Unspecified ovarian cyst, unspecified side: Secondary | ICD-10-CM | POA: Insufficient documentation

## 2012-08-15 DIAGNOSIS — Z01812 Encounter for preprocedural laboratory examination: Secondary | ICD-10-CM | POA: Insufficient documentation

## 2012-08-15 HISTORY — DX: Anemia, unspecified: D64.9

## 2012-08-15 HISTORY — DX: Dermatitis, unspecified: L30.9

## 2012-08-15 LAB — CBC WITH DIFFERENTIAL/PLATELET
Eosinophils Absolute: 1.7 10*3/uL — ABNORMAL HIGH (ref 0.0–0.7)
Hemoglobin: 12 g/dL (ref 12.0–15.0)
Lymphs Abs: 2.1 10*3/uL (ref 0.7–4.0)
MCH: 28 pg (ref 26.0–34.0)
Monocytes Relative: 4 % (ref 3–12)
Neutro Abs: 3.6 10*3/uL (ref 1.7–7.7)
Neutrophils Relative %: 47 % (ref 43–77)
RBC: 4.29 MIL/uL (ref 3.87–5.11)

## 2012-08-15 LAB — COMPREHENSIVE METABOLIC PANEL
Alkaline Phosphatase: 36 U/L — ABNORMAL LOW (ref 39–117)
BUN: 14 mg/dL (ref 6–23)
Chloride: 106 mEq/L (ref 96–112)
GFR calc Af Amer: >90 mL/min (ref >90)
GFR calc non Af Amer: >90 mL/min (ref >90)
Glucose, Bld: 86 mg/dL (ref 70–99)
Potassium: 5 mEq/L (ref 3.5–5.1)
Total Bilirubin: 0.2 mg/dL — ABNORMAL LOW (ref 0.3–1.2)

## 2012-08-15 LAB — SURGICAL PCR SCREEN
MRSA, PCR: INVALID — AB
Staphylococcus aureus: INVALID — AB

## 2012-08-15 LAB — PREGNANCY, URINE: Preg Test, Ur: NEGATIVE

## 2012-08-15 NOTE — Patient Instructions (Addendum)
Dawn Mayo  08/15/2012                           YOUR PROCEDURE IS SCHEDULED ON: 08/19/12               PLEASE REPORT TO SHORT STAY CENTER AT : 11:30 AM               CALL THIS NUMBER IF ANY PROBLEMS THE DAY OF SURGERY :               [phone]                      REMEMBER:   Do not eat food or drink liquids AFTER MIDNIGHT  May have clear liquids UNTIL 6 HOURS BEFORE SURGERY (8:00 AM)  Clear liquids include soda, tea, black coffee, apple or grape juice, broth.  Take these medicines the morning of surgery with A SIP OF WATER: NONE   Do not wear jewelry, make-up   Do not wear lotions, powders, or perfumes.   Do not shave legs or underarms 12 hrs. before surgery (men may shave face)  Do not bring valuables to the hospital.  Contacts, dentures or bridgework may not be worn into surgery.  Leave suitcase in the car. After surgery it may be brought to your room.  For patients admitted to the hospital more than one night, checkout time is 11:00                          The day of discharge.   Patients discharged the day of surgery will not be allowed to drive home                             If going home same day of surgery, must have someone stay with you first                           24 hrs at home and arrange for some one to drive you home from hospital.    Special Instructions:   Please read over the following fact sheets that you were given:               1. MRSA  INFORMATION                      2. [employer] PREPARING FOR SURGERY SHEET               3. CLEAR LIQUIDS FOR 24 HRS PREOP               4. INCENTIVE SPIROMETER                                                X_____________________________________________________________________        Failure to follow these instructions may result in cancellation of your surgery

## 2012-08-18 LAB — MRSA CULTURE

## 2012-08-19 ENCOUNTER — Encounter (HOSPITAL_COMMUNITY): Payer: Self-pay | Admitting: *Deleted

## 2012-08-19 ENCOUNTER — Encounter (HOSPITAL_COMMUNITY)
Admission: RE | Disposition: A | Discharge status: Home / Self Care | Payer: Self-pay | Source: Ambulatory Visit | Attending: Gynecologic Oncology

## 2012-08-19 ENCOUNTER — Inpatient Hospital Stay (HOSPITAL_COMMUNITY)
Admission: RE | Admit: 2012-08-19 | Discharge: 2012-08-22 | DRG: 359 | Disposition: A | Discharge status: Home / Self Care | Payer: BC Managed Care – PPO | Source: Ambulatory Visit | Attending: Gynecologic Oncology | Admitting: Gynecologic Oncology

## 2012-08-19 ENCOUNTER — Ambulatory Visit (HOSPITAL_COMMUNITY): Payer: BC Managed Care – PPO | Admitting: Anesthesiology

## 2012-08-19 ENCOUNTER — Encounter (HOSPITAL_COMMUNITY): Payer: Self-pay | Admitting: Anesthesiology

## 2012-08-19 DIAGNOSIS — R1903 Right lower quadrant abdominal swelling, mass and lump: Secondary | ICD-10-CM

## 2012-08-19 DIAGNOSIS — N83209 Unspecified ovarian cyst, unspecified side: Secondary | ICD-10-CM | POA: Diagnosis present

## 2012-08-19 DIAGNOSIS — D259 Leiomyoma of uterus, unspecified: Secondary | ICD-10-CM

## 2012-08-19 DIAGNOSIS — D279 Benign neoplasm of unspecified ovary: Principal | ICD-10-CM | POA: Diagnosis present

## 2012-08-19 DIAGNOSIS — Z01812 Encounter for preprocedural laboratory examination: Secondary | ICD-10-CM

## 2012-08-19 HISTORY — PX: LAPAROTOMY: SHX154

## 2012-08-19 SURGERY — LAPAROTOMY, EXPLORATORY
Anesthesia: General | Laterality: Bilateral | Wound class: Clean Contaminated

## 2012-08-19 MED ORDER — HYDROMORPHONE HCL PF 1 MG/ML IJ SOLN
0.5000 mg | INTRAMUSCULAR | Status: DC | PRN
  Administered 2012-08-19: 0.5 mg via INTRAVENOUS
  Filled 2012-08-19: qty 1

## 2012-08-19 MED ORDER — HYDROMORPHONE HCL PF 1 MG/ML IJ SOLN
INTRAMUSCULAR | Status: DC | PRN
  Administered 2012-08-19: 1 mg via INTRAVENOUS

## 2012-08-19 MED ORDER — ZOLPIDEM TARTRATE 5 MG PO TABS: 5.0000 mg | ORAL_TABLET | Freq: Every evening | ORAL | Status: DC | PRN

## 2012-08-19 MED ORDER — MEPERIDINE HCL 50 MG/ML IJ SOLN: 6.2500 mg | INTRAMUSCULAR | Status: DC | PRN

## 2012-08-19 MED ORDER — PROPOFOL 10 MG/ML IV BOLUS
INTRAVENOUS | Status: DC | PRN
  Administered 2012-08-19: 120 mg via INTRAVENOUS

## 2012-08-19 MED ORDER — SODIUM CHLORIDE 0.9 % IJ SOLN
INTRAMUSCULAR | Status: DC | PRN
  Administered 2012-08-19: 14:00:00

## 2012-08-19 MED ORDER — KETOROLAC TROMETHAMINE 30 MG/ML IJ SOLN
30.0000 mg | Freq: Four times a day (QID) | INTRAMUSCULAR | Status: DC
  Administered 2012-08-19 – 2012-08-22 (×10): 30 mg via INTRAVENOUS
  Filled 2012-08-19 (×15): qty 1

## 2012-08-19 MED ORDER — KETOROLAC TROMETHAMINE 30 MG/ML IJ SOLN
30.0000 mg | Freq: Four times a day (QID) | INTRAMUSCULAR | Status: DC
  Filled 2012-08-19 (×13): qty 1

## 2012-08-19 MED ORDER — VASOPRESSIN 20 UNIT/ML IJ SOLN
INTRAMUSCULAR | Status: DC | PRN
  Administered 2012-08-19: 20 [IU]

## 2012-08-19 MED ORDER — HEPARIN SODIUM (PORCINE) 1000 UNIT/ML IJ SOLN
INTRAMUSCULAR | Status: AC
  Filled 2012-08-19: qty 1

## 2012-08-19 MED ORDER — LACTATED RINGERS IV SOLN: INTRAVENOUS | Status: DC

## 2012-08-19 MED ORDER — BUPIVACAINE LIPOSOME 1.3 % IJ SUSP
20.0000 mL | Freq: Once | INTRAMUSCULAR | Status: DC
  Filled 2012-08-19: qty 20

## 2012-08-19 MED ORDER — ROCURONIUM BROMIDE 100 MG/10ML IV SOLN
INTRAVENOUS | Status: DC | PRN
  Administered 2012-08-19: 5 mg via INTRAVENOUS
  Administered 2012-08-19: 40 mg via INTRAVENOUS

## 2012-08-19 MED ORDER — HEPARIN SODIUM (PORCINE) 5000 UNIT/ML IJ SOLN
INTRAMUSCULAR | Status: DC | PRN
  Administered 2012-08-19: 5000 [IU]

## 2012-08-19 MED ORDER — VASOPRESSIN 20 UNIT/ML IJ SOLN
INTRAMUSCULAR | Status: AC
  Filled 2012-08-19: qty 1

## 2012-08-19 MED ORDER — ONDANSETRON HCL 4 MG/2ML IJ SOLN
INTRAMUSCULAR | Status: DC | PRN
  Administered 2012-08-19: 4 mg via INTRAVENOUS

## 2012-08-19 MED ORDER — FENTANYL CITRATE 0.05 MG/ML IJ SOLN
INTRAMUSCULAR | Status: DC | PRN
  Administered 2012-08-19: 50 ug via INTRAVENOUS
  Administered 2012-08-19: 100 ug via INTRAVENOUS
  Administered 2012-08-19 (×2): 50 ug via INTRAVENOUS

## 2012-08-19 MED ORDER — CEFAZOLIN SODIUM-DEXTROSE 2-3 GM-% IV SOLR
INTRAVENOUS | Status: AC
  Filled 2012-08-19: qty 50

## 2012-08-19 MED ORDER — DEXAMETHASONE SODIUM PHOSPHATE 10 MG/ML IJ SOLN
INTRAMUSCULAR | Status: DC | PRN
  Administered 2012-08-19: 8 mg via INTRAVENOUS

## 2012-08-19 MED ORDER — MAGNESIUM HYDROXIDE 400 MG/5ML PO SUSP
30.0000 mL | Freq: Three times a day (TID) | ORAL | Status: AC
  Administered 2012-08-20: 30 mL via ORAL
  Filled 2012-08-19 (×2): qty 30

## 2012-08-19 MED ORDER — LIDOCAINE HCL (CARDIAC) 20 MG/ML IV SOLN
INTRAVENOUS | Status: DC | PRN
  Administered 2012-08-19: 50 mg via INTRAVENOUS

## 2012-08-19 MED ORDER — 0.9 % SODIUM CHLORIDE (POUR BTL) OPTIME
TOPICAL | Status: DC | PRN
  Administered 2012-08-19: 2000 mL

## 2012-08-19 MED ORDER — ACETAMINOPHEN 10 MG/ML IV SOLN
INTRAVENOUS | Status: AC
  Filled 2012-08-19: qty 100

## 2012-08-19 MED ORDER — KCL IN DEXTROSE-NACL 20-5-0.45 MEQ/L-%-% IV SOLN
INTRAVENOUS | Status: DC
  Administered 2012-08-19: 18:00:00 via INTRAVENOUS
  Filled 2012-08-19 (×3): qty 1000

## 2012-08-19 MED ORDER — NEOSTIGMINE METHYLSULFATE 1 MG/ML IJ SOLN
INTRAMUSCULAR | Status: DC | PRN
  Administered 2012-08-19: 4 mg via INTRAVENOUS

## 2012-08-19 MED ORDER — GLYCOPYRROLATE 0.2 MG/ML IJ SOLN
INTRAMUSCULAR | Status: DC | PRN
  Administered 2012-08-19: 0.6 mg via INTRAVENOUS

## 2012-08-19 MED ORDER — MIDAZOLAM HCL 5 MG/5ML IJ SOLN
INTRAMUSCULAR | Status: DC | PRN
  Administered 2012-08-19: 2 mg via INTRAVENOUS

## 2012-08-19 MED ORDER — OXYCODONE-ACETAMINOPHEN 5-325 MG PO TABS: 1.0000 | ORAL_TABLET | ORAL | Status: DC | PRN

## 2012-08-19 MED ORDER — FENTANYL CITRATE 0.05 MG/ML IJ SOLN: 25.0000 ug | INTRAMUSCULAR | Status: DC | PRN

## 2012-08-19 MED ORDER — ONDANSETRON HCL 4 MG PO TABS: 4.0000 mg | ORAL_TABLET | Freq: Four times a day (QID) | ORAL | Status: DC | PRN

## 2012-08-19 MED ORDER — ONDANSETRON HCL 4 MG/2ML IJ SOLN
4.0000 mg | Freq: Four times a day (QID) | INTRAMUSCULAR | Status: DC | PRN
  Administered 2012-08-19: 4 mg via INTRAVENOUS
  Filled 2012-08-19: qty 2

## 2012-08-19 MED ORDER — PROMETHAZINE HCL 25 MG/ML IJ SOLN: 6.2500 mg | INTRAMUSCULAR | Status: DC | PRN

## 2012-08-19 MED ORDER — CEFAZOLIN SODIUM-DEXTROSE 2-3 GM-% IV SOLR
2.0000 g | INTRAVENOUS | Status: AC
  Administered 2012-08-19: 2 g via INTRAVENOUS

## 2012-08-19 MED ORDER — LACTATED RINGERS IV SOLN
INTRAVENOUS | Status: DC
  Administered 2012-08-19: 1000 mL via INTRAVENOUS
  Administered 2012-08-19: 15:00:00 via INTRAVENOUS

## 2012-08-19 MED ORDER — ACETAMINOPHEN 10 MG/ML IV SOLN
INTRAVENOUS | Status: DC | PRN
  Administered 2012-08-19: 1000 mg via INTRAVENOUS

## 2012-08-19 SURGICAL SUPPLY — 45 items
APL SKNCLS STERI-STRIP NONHPOA (GAUZE/BANDAGES/DRESSINGS) ×1
ATTRACTOMAT 16X20 MAGNETIC DRP (DRAPES) ×2 IMPLANT
BAG URINE DRAINAGE (UROLOGICAL SUPPLIES) ×1 IMPLANT
BENZOIN TINCTURE PRP APPL 2/3 (GAUZE/BANDAGES/DRESSINGS) ×1 IMPLANT
BLADE EXTENDED COATED 6.5IN (ELECTRODE) ×2 IMPLANT
BRR ADH 5X3 SEPRAFILM 6 SHT (MISCELLANEOUS) ×1
CANISTER SUCTION 2500CC (MISCELLANEOUS) ×2 IMPLANT
CATH FOLEY 2WAY SLVR  5CC 16FR (CATHETERS)
CATH FOLEY 2WAY SLVR 5CC 16FR (CATHETERS) ×1 IMPLANT
CLIP TI MEDIUM LARGE 6 (CLIP) ×2 IMPLANT
CLOTH BEACON ORANGE TIMEOUT ST (SAFETY) ×2 IMPLANT
COVER SURGICAL LIGHT HANDLE (MISCELLANEOUS) ×1 IMPLANT
DRAPE TABLE BACK 44X90 PK DISP (DRAPES) ×1 IMPLANT
DRAPE UTILITY XL STRL (DRAPES) ×1 IMPLANT
DRAPE WARM FLUID 44X44 (DRAPE) ×2 IMPLANT
DRSG TELFA 4X14 ISLAND ADH (GAUZE/BANDAGES/DRESSINGS) ×1 IMPLANT
ELECT REM PT RETURN 9FT ADLT (ELECTROSURGICAL) ×2
ELECTRODE REM PT RTRN 9FT ADLT (ELECTROSURGICAL) ×1 IMPLANT
GAUZE SPONGE 4X4 16PLY XRAY LF (GAUZE/BANDAGES/DRESSINGS) IMPLANT
GLOVE BIO SURGEON STRL SZ 6.5 (GLOVE) ×4 IMPLANT
GLOVE BIO SURGEON STRL SZ7.5 (GLOVE) ×4 IMPLANT
GLOVE BIOGEL PI IND STRL 7.0 (GLOVE) ×1 IMPLANT
GLOVE BIOGEL PI INDICATOR 7.0 (GLOVE) ×1
HOLDER FOLEY CATH W/STRAP (MISCELLANEOUS) ×2 IMPLANT
NDL HYPO 25X1 1.5 SAFETY (NEEDLE) ×1 IMPLANT
NEEDLE HYPO 22GX1.5 SAFETY (NEEDLE) ×1 IMPLANT
NEEDLE HYPO 25X1 1.5 SAFETY (NEEDLE) ×2 IMPLANT
NS IRRIG 1000ML POUR BTL (IV SOLUTION) ×4 IMPLANT
PACK ABDOMINAL WL (CUSTOM PROCEDURE TRAY) ×2 IMPLANT
SEPRAFILM PROCEDURAL PACK 3X5 (MISCELLANEOUS) ×1 IMPLANT
SHEET LAVH (DRAPES) ×2 IMPLANT
SPONGE LAP 18X18 X RAY DECT (DISPOSABLE) ×2 IMPLANT
STAPLER VISISTAT 35W (STAPLE) ×1 IMPLANT
STRIP CLOSURE SKIN 1/2X4 (GAUZE/BANDAGES/DRESSINGS) ×1 IMPLANT
SUT MON AB 2-0 SH 27 (SUTURE) ×1 IMPLANT
SUT PDS AB 1 CTXB1 36 (SUTURE) ×4 IMPLANT
SUT VIC AB 0 CT1 36 (SUTURE) ×13 IMPLANT
SUT VIC AB 2-0 CT2 27 (SUTURE) IMPLANT
SUT VIC AB 4-0 PS2 18 (SUTURE) ×1 IMPLANT
SUT VICRYL 2 0 18  UND BR (SUTURE) ×1
SUT VICRYL 2 0 18 UND BR (SUTURE) ×1 IMPLANT
SYR CONTROL 10ML LL (SYRINGE) ×2 IMPLANT
TOWEL OR 17X26 10 PK STRL BLUE (TOWEL DISPOSABLE) ×2 IMPLANT
TOWEL OR NON WOVEN STRL DISP B (DISPOSABLE) ×1 IMPLANT
TRAY FOLEY BAG SILVER LF 14FR (CATHETERS) ×1 IMPLANT

## 2012-08-19 NOTE — Anesthesia Preprocedure Evaluation (Addendum)
Anesthesia Evaluation  Patient identified by MRN, date of birth, ID band Patient awake    Reviewed: Allergy & Precautions, H&P , NPO status , Patient's Chart, lab work & pertinent test results  Airway Mallampati: I TM Distance: >3 FB Neck ROM: Full    Dental no notable dental hx.    Pulmonary neg pulmonary ROS,  breath sounds clear to auscultation  Pulmonary exam normal       Cardiovascular negative cardio ROS  Rhythm:Regular Rate:Normal     Neuro/Psych negative neurological ROS  negative psych ROS   GI/Hepatic negative GI ROS, Neg liver ROS,   Endo/Other  negative endocrine ROS  Renal/GU negative Renal ROS  negative genitourinary   Musculoskeletal negative musculoskeletal ROS (+)   Abdominal   Peds negative pediatric ROS (+)  Hematology negative hematology ROS (+)   Anesthesia Other Findings   Reproductive/Obstetrics negative OB ROS                           Anesthesia Physical Anesthesia Plan  ASA: I  Anesthesia Plan: General   Post-op Pain Management:    Induction: Intravenous  Airway Management Planned: Oral ETT  Additional Equipment:   Intra-op Plan:   Post-operative Plan: Extubation in OR  Informed Consent: I have reviewed the patients History and Physical, chart, labs and discussed the procedure including the risks, benefits and alternatives for the proposed anesthesia with the patient or authorized representative who has indicated his/her understanding and acceptance.   Dental advisory given  Plan Discussed with: CRNA  Anesthesia Plan Comments:         Anesthesia Quick Evaluation  

## 2012-08-19 NOTE — Interval H&P Note (Signed)
History and Physical Interval Note:  08/19/2012 1:17 PM  Dawn Mayo  has presented today for surgery, with the diagnosis of BILATERAL OVARIAN CYST  The various methods of treatment have been discussed with the patient and family. After consideration of risks, benefits and other options for treatment, the patient has consented to  Procedure(s): EXPLORATORY LAPAROTOMY, BILATERAL OVARIAN CYSTECTOMIES, POSSIBLE BSO/SURGICAL STAGING (Bilateral) as a surgical intervention .  The patient's history has been reviewed, patient examined, no change in status, stable for surgery.  I have reviewed the patient's chart and labs.  Questions were answered to the patient's satisfaction.     Mike Berntsen A.

## 2012-08-19 NOTE — H&P (View-Only) (Signed)
Consult Note: Gyn-Onc  Consult was requested by Dr. Shawnie Pons for the evaluation of Dawn Mayo 27 y.o. female  CC: Pelvic mass   Assessment/Plan:  Dawn. Abbigaile Mayo  is a 27 y.o.  year old  with a 15.1 cm complex right adnexal mass and normal CA125.  The left adnexa suspicious for an abnormal appearing ovarian cysts. [name] strongly desires to follow consult speech and her PT any with her yet unborn children.  That is she strongly desires preservation of fertility.   She is aware that the index of suspicion is low that this is a malignancy. I've also counseled her that there are ovarian cancers for which ovarian preservation if possible.  In the event that ovarian preservation is not possible she would strongly desire uterine preservation.  She was counseled of the operative procedure most likely would be bilateral ovarian cystectomy possible omentectomy lymph node dissection debulking or other indicated procedures. The risks discussed were inclusive of infection bleeding damage to surrounding structures prolonged hospitalization and reoperation.  We will followup with [name] regarding the surgery date. She is aware that the surgeon will be Dr. Rockney Ghee. I've also asked her to substitute the metal periclitoral ring for a plastic device.   HPI:   07/01/12 CT Abdomen and pelvis Findings: The liver, spleen, pancreas, adrenal glands, kidneys are within normal limits. There is a large 9.7 x 15.1 x 12.7 cm complex solid and cystic mass emanating from the right adnexa. There are no definite fatty elements or calcified elements. 2.1 x 3.2 cm avidly enhancing and heterogeneous pedunculated fibroid from the left side of the fundus is noted. Irregular heterogeneous complex cystic lesion within the left adnexa measures 2.0 cm and likely represents an involuting hemorrhagic cyst. Trace free fluid is present in the pelvis.  07/01/12 Pelvic ultrasound The right complex adnexal mass demonstrates a  solid component measuring 5.0 x 5.0 by 5.2 cm which contains some internal flow with color Doppler exam as well as a slightly thickened septation measuring 3.5 mm. More normal appearing ovarian tissue is seen at  this periphery of this lesion  suggesting this is ovarian in etiology. This could represent a dermoid but the appearance is not typical and the solid component would be considered a worrisome feature. A malignant neoplastic process such as a serous cystadenocarcinoma or borderline tumor cannot be excluded.  Reports weight gain and increasing abdominal girth.  No change in bowel or bladder habits.      Lab Results  Component Value Date   CA125 14.4 07/01/2012     Current Meds:  Outpatient Encounter Prescriptions as of 07/29/2012  Medication Sig Dispense Refill  . metroNIDAZOLE (FLAGYL) 500 MG tablet Take 1 tablet (500 mg total) by mouth 2 (two) times daily.  14 tablet  0   No facility-administered encounter medications on file as of 07/29/2012.    Allergy: No Known Allergies  Social Hx:   History   Social History  . Marital Status: Single    Spouse Name: N/A    Number of Children: N/A  . Years of Education: N/A   Occupational History  . Not on file.   Social History Main Topics  . Smoking status: Never Smoker   . Smokeless tobacco: Not on file  . Alcohol Use: No  . Drug Use: No  . Sexually Active: Not Currently    Birth Control/ Protection: None   Other Topics Concern  . Not on file   Social History Narrative  . No  narrative on file  Dawn Dawn Mayo  is pending [employer] [facility]. Final examinations are scheduled for next week.  Her appointment is that presently of a hairstylist.  Past Surgical Hx:  Past Surgical History  Procedure Laterality Date  . Dilation and curettage of uterus      Past Medical Hx:  Past Medical History  Diagnosis Date  . Asthma   . Fibroids   . Ovarian cyst     Past Gynecological History:  G2P0.  Menarche 11 regular menses.  Patient's last menstrual period was 07/11/2012.  Family Hx: No family history on file.  Review of Systems:  Constitutional  Feels well,  Reports weight gain. Cardiovascular  No chest pain, shortness of breath, or edema  Pulmonary  No cough or wheeze.  Gastro Intestinal  No nausea, vomitting, or diarrhoea. No bright red blood per rectum, infrequent abdominal pain, no  change in bowel movement, or constipation. Reports increasing abdominal girth. Genito Urinary  No frequency, urgency, dysuria, no vaginal bleeding or discharge Musculo Skeletal  No myalgia, arthralgia, joint swelling or pain  Neurologic  No weakness, numbness, change in gait,  Psychology  No depression, anxiety, insomnia.   Vitals:  BP 100/64  Pulse 64  Temp(Src) 98.3 F (36.8 C) (Oral)  Resp 14  Ht 5' 1.46" (1.561 m)  Wt 130 lb 9.6 oz (59.24 kg)  BMI 24.31 kg/m2  LMP 07/11/2012  Physical Exam: WD in NAD Neck  Supple NROM, without any enlargements.  Lymph Node Survey No cervical supraclavicular or inguinal adenopathy Cardiovascular  Pulse normal rate, regularity and rhythm. S1 and S2 normal.  Lungs  Clear to auscultation bilateraly, without wheezes/crackles/rhonchi. Good air movement.  Psychiatry  Alert and oriented to person, place, and time  Abdomen  Normoactive bowel sounds, abdomen soft, non-tender mobile pelvic mass.  Back No CVA tenderness Genito Urinary  Vulva/vagina: Normal external female genitalia. Periclitoral metal ring  No lesions. No discharge or bleeding.  Bladder/urethra:  No lesions or masses  Vagina: Well estrogenized Cervix: Normal appearing, no lesions.  Uterus: Small, mobile, no parametrial involvement or nodularity.  Adnexa: large mobile pelvic mass.  No cul de sac nodularity Rectal  Good tone, no masses no cul de sac nodularity.  Extremities  No bilateral cyanosis, clubbing or edema.   Dawn Schimke, MD, PhD 07/29/2012, 3:14 PM

## 2012-08-19 NOTE — Transfer of Care (Signed)
Immediate Anesthesia Transfer of Care Note  Patient: Dawn Mayo  Procedure(s) Performed: Procedure(s): EXPLORATORY LAPAROTOMY, RIGHT SALPINGOOOPHORECTOMY,  MYOMECTOMY (Bilateral)  Patient Location: PACU  Anesthesia Type:General  Level of Consciousness: sedated  Airway & Oxygen Therapy: Patient Spontanous Breathing and Patient connected to face mask oxygen  Post-op Assessment: Report given to PACU RN and Post -op Vital signs reviewed and stable  Post vital signs: Reviewed and stable  Complications: No apparent anesthesia complications

## 2012-08-19 NOTE — Op Note (Signed)
PATIENT: Dawn Mayo DATE OF BIRTH: Feb 20, 1986 ENCOUNTER DATE: 08/19/2012   Preop Diagnosis: 15 cm right adnexal mass, normal CA 125  Postoperative Diagnosis: Serous cystadenoma, uterine fibroids  Surgery: Exploratory laparotomy, right salpingo-oophorectomy, uterine myomectomy x2  Surgeons:  Rejeana Brock A. Duard Brady, MD; Antionette Char, MD   Assistant: Telford Nab   Anesthesia: General   Estimated blood loss: 20 ml   IVF: 1000 ml   Urine output: 50 ml   Complications: None   Pathology: Right fallopian tube and ovary, uterine myoma x2 to  Operative findings: 15 cm right ovarian mass completely replacing the right ovary with no normal right ovary identifiable. Normal appearing left adnexa. A 3 x 4 cm pedunculated uterine myoma near the left cornea with a smaller 1 x 1 cm pedunculated myoma. Frozen section revealed a serous cystadenoma. Abdominal survey was negative with a normal appearing appendix.  Procedure: The patient was identified in the preoperative holding area. Informed consent was signed on the chart. Patient was seen history was reviewed and exam was performed.   The patient was then taken to the operating room and placed in the supine position with SCD hose on. General anesthesia was then induced without difficulty. She was then placed in the dorsolithotomy position.First timeout was performed to confirm the patient procedure antibiotic allergy status, estimated estimated blood loss and OR time. The perineum was then prepped in the usual fashion with Betadine. A 14 French Foley was inserted into the bladder under sterile conditions. The abdomen was then prepped with 1 Chlor prep sponges per protocol. Patient was then draped after the prep was dried. Second timeout was performed to confirm the above.   A vertical infraumbilical incision was made with a knife and carried down to the underlying fashion using Bovie cautery. The fascia was scored in the midline. It was extended  superiorly and inferiorly. The peritoneal cavity was entered with sharp dissection the peritoneal incision was extended superiorly and inferiorly with visualization of the peritoneal cavity. The bladder flap was created. There was approximately 50 mL of clear ascites in the abdomen which was removed. The right adnexal mass was delivered through the incision without difficulty. It was not ruptured. A window was made between the utero-ovarian in the infundibulopelvic vessels. The IP was clamped x2 transected and suture ligated. The utero-ovarian vessels on the right side were transected after being clamped with a Kelly clamp. The area was suture ligated. The right adnexa was sent for frozen section.  The left adnexa was completely normal. The myomas as described above are identified. Using the solution of 20 mL of Pitressin within 60 mL of saline, 5 mL of this solution was injected into the serosa of the uterus. Bovie cautery was used to incise the serosa of the uterus and the myomas were delivered without difficulty. There was no breech of the myometrium. The serosa of the uterus was closed using a 2-0 Monocryl and a locked fashion. Seprafilm was placed over the area of the uterine incision. The retractors removed from the patient's abdomen. The abdomen was irrigated not pedicles were noted to be hemostatic. The remainder Seprafilm was placed between the omentum and the anterior abdominal wall. The fascia was closed in a running mass closure with 0 Vicryl. Subcutaneous tissues were irrigated and made hemostatic. Exparel was used for postoperative pain control. The skin was closed using 4-0 Vicryl subcuticular stitch. Benzoin with Steri-Strips was applied.  All instrument, needle, laparotomy and Ray-Tec counts were correct x2. The patient tolerated the  procedure well and was taken to the recovery room in stable condition. This is Cleda Mccreedy dictating an operative note on patient Dawn Mayo.

## 2012-08-19 NOTE — Anesthesia Postprocedure Evaluation (Signed)
  Anesthesia Post-op Note  Patient: Dawn Mayo  Procedure(s) Performed: Procedure(s) (LRB): EXPLORATORY LAPAROTOMY, RIGHT SALPINGOOOPHORECTOMY,  MYOMECTOMY (Bilateral)  Patient Location: PACU  Anesthesia Type: General  Level of Consciousness: awake and alert   Airway and Oxygen Therapy: Patient Spontanous Breathing  Post-op Pain: mild  Post-op Assessment: Post-op Vital signs reviewed, Patient's Cardiovascular Status Stable, Respiratory Function Stable, Patent Airway and No signs of Nausea or vomiting  Last Vitals:  Filed Vitals:   08/19/12 1728  BP: 104/58  Pulse: 81  Temp: 36.7 C  Resp: 16    Post-op Vital Signs: stable   Complications: No apparent anesthesia complications

## 2012-08-20 ENCOUNTER — Encounter (HOSPITAL_COMMUNITY): Payer: Self-pay | Admitting: Gynecologic Oncology

## 2012-08-20 LAB — BASIC METABOLIC PANEL
Calcium: 8.5 mg/dL (ref 8.4–10.5)
GFR calc Af Amer: >90 mL/min (ref >90)
GFR calc non Af Amer: >90 mL/min (ref >90)
Sodium: 135 mEq/L (ref 135–145)

## 2012-08-20 LAB — CBC
Platelets: 290 10*3/uL (ref 150–400)
RBC: 3.78 MIL/uL — ABNORMAL LOW (ref 3.87–5.11)
WBC: 12.8 10*3/uL — ABNORMAL HIGH (ref 4.0–10.5)

## 2012-08-20 MED ORDER — ACETAMINOPHEN 325 MG PO TABS: 325.0000 mg | ORAL_TABLET | Freq: Four times a day (QID) | ORAL | Status: DC | PRN

## 2012-08-20 NOTE — Progress Notes (Signed)
Pt encouraged to walk. She does not feel she will tolerate ambulating at this time with continuing nausea and drowsiness.

## 2012-08-20 NOTE — Progress Notes (Signed)
1 Day Post-Op Procedure(s) (LRB): EXPLORATORY LAPAROTOMY, RIGHT SALPINGOOOPHORECTOMY,  MYOMECTOMY (Bilateral)  Subjective: Patient reports ambulating without difficulty.  Tolerating liquids this am after resolution of nausea overnight.  "I could eat a steak."  Adequate pain relief reported.  Denies chest pain, dyspnea, nausea, emesis, passing flatus, or having a bowel movement.  Objective: Vital signs in last 24 hours: Temp:  [97.4 F (36.3 C)-98.7 F (37.1 C)] 98.7 F (37.1 C) (05/21 0600) Pulse Rate:  [59-90] 81 (05/21 0600) Resp:  [12-19] 16 (05/21 0600) BP: (94-123)/(46-80) 102/60 mmHg (05/21 0600) SpO2:  [97 %-100 %] 100 % (05/21 0600) Weight:  [134 lb 1 oz (60.81 kg)] 134 lb 1 oz (60.81 kg) (05/20 1637) Last BM Date: 08/19/12  Intake/Output from previous day: 05/20 0701 - 05/21 0700 In: 3500.8 [P.O.:480; I.V.:3020.8] Out: 2340 [Urine:2320; Blood:20]  Physical Examination: General: alert, cooperative and no distress Resp: clear to auscultation bilaterally Cardio: regular rate and rhythm, S1, S2 normal, no murmur, click, rub or gallop GI: soft, non-tender; bowel sounds normal; no masses,  no organomegaly Extremities: extremities normal, atraumatic, no cyanosis or edema Midline incision with steri strips clean, dry, and intact  Labs: WBC/Hgb/Hct/Plts:  12.8/10.3/31.3/290 (05/21 0400) BUN/Cr/glu/ALT/AST/amyl/lip:  6/0.69/--/--/--/--/-- (05/21 0400)  Assessment: 27 y.o. s/p Procedure(s): EXPLORATORY LAPAROTOMY, RIGHT SALPINGOOOPHORECTOMY,  MYOMECTOMY: stable Pain:  Pain is well-controlled on oral medications.  Heme:  Hgb 10.3 and Hct 31.3 this am.  Stable post-operatively.  CV:  BP and HR stable post-operatively.  GI:  Tolerating po: Yes    FEN:  Stable post-operatively.  Prophylaxis: intermittent pneumatic compression boots.  Plan: Saline lock IV Advance diet as tolerated Encourage ambulation, IS use, deep breathing, and coughing Continue post-operative plan of  care   LOS: 1 day    CROSS, MELISSA DEAL 08/20/2012, 10:01 AM

## 2012-08-20 NOTE — Progress Notes (Signed)
Pt feeling more alert with improved nausea this AM. Pt OOB, ambulated around room. Will continue to monitor

## 2012-08-20 NOTE — Care Management Note (Signed)
    Page 1 of 1   08/20/2012     1:30:42 PM   CARE MANAGEMENT NOTE 08/20/2012  Patient:  Dawn Mayo, Dawn Mayo   Account Number:  [id number]  Date Initiated:  08/20/2012  Documentation initiated by:  [name]  Subjective/Objective Assessment:   27 yo female admitted s/p explor. lap, right oopherectomy, myomectomy. PTA lived at home with friend/roommate.     Action/Plan:   Home when stable   Anticipated DC Date:  08/23/2012   Anticipated DC Plan:  HOME/SELF CARE      DC Planning Services  CM consult      Choice offered to / List presented to:             Status of service:  Completed, signed off Medicare Important Message given?   (If response is "NO", the following Medicare IM given date fields will be blank) Date Medicare IM given:   Date Additional Medicare IM given:    Discharge Disposition:  HOME/SELF CARE  Per UR Regulation:  Reviewed for med. necessity/level of care/duration of stay  If discussed at Long Length of Stay Meetings, dates discussed:    Comments:

## 2012-08-20 NOTE — Progress Notes (Signed)
Pt encouraged to walk as soon as possible.  Pt very drowsy and nauseated. Dawn Mayo

## 2012-08-21 NOTE — Progress Notes (Signed)
2 Days Post-Op Procedure(s) (LRB): EXPLORATORY LAPAROTOMY, RIGHT SALPINGOOOPHORECTOMY,  MYOMECTOMY (Bilateral)  Subjective: Patient reports moderate soreness with ambulation.  Tolerating solid food.  Ambulating without difficulty.  Denies nausea, emesis, passing flatus, or having a bowel movement.   Objective: Vital signs in last 24 hours: Temp:  [98.5 F (36.9 C)-100.3 F (37.9 C)] 98.7 F (37.1 C) (05/22 1610) Pulse Rate:  [62-102] 97 (05/22 0632) Resp:  [16-18] 16 (05/22 9604) BP: (98-108)/(51-68) 104/61 mmHg (05/22 0632) SpO2:  [98 %-100 %] 98 % (05/22 0632) Last BM Date: 08/19/12  Intake/Output from previous day: 05/21 0701 - 05/22 0700 In: 644.2 [P.O.:240; I.V.:404.2] Out: 1650 [Urine:1650]  Physical Examination: General: alert, cooperative and no distress Resp: clear to auscultation bilaterally Cardio: regular rate and rhythm, S1, S2 normal, no murmur, click, rub or gallop GI: soft, non-tender; bowel sounds normal; no masses,  no organomegaly and incision: midline incision with steri strips clean, dry, and intact Extremities: extremities normal, atraumatic, no cyanosis or edema  Assessment: 27 y.o. s/p Procedure(s): EXPLORATORY LAPAROTOMY, RIGHT SALPINGOOOPHORECTOMY,  MYOMECTOMY: stable Pain:  Pain is well-controlled on oral medications.  CV:  BP and HR stable post-operatively.  GI:  Tolerating po: Yes     Prophylaxis: intermittent pneumatic compression boots.  Plan: Encourage ambulation, IS use, deep breathing, and coughing Continue post-operative plan of care Plan to discharge when +flatus or +bowel movement   LOS: 2 days    Dawn Mayo 08/21/2012, 8:56 AM

## 2012-08-22 ENCOUNTER — Encounter: Payer: Self-pay | Admitting: Gynecologic Oncology

## 2012-08-22 MED ORDER — OXYCODONE-ACETAMINOPHEN 5-325 MG PO TABS: 1.0000 | ORAL_TABLET | ORAL | Status: DC | PRN

## 2012-08-22 MED ORDER — IBUPROFEN 800 MG PO TABS: 800.0000 mg | ORAL_TABLET | Freq: Three times a day (TID) | ORAL | Status: DC | PRN

## 2012-08-22 NOTE — Progress Notes (Signed)
Voices understanding of d/c instructions.  No changes noted since am assessment.   

## 2012-08-22 NOTE — Discharge Summary (Signed)
Physician Discharge Summary  Patient ID: Dawn Mayo MRN: [id number] DOB/AGE: 29-11-1985 29 y.o.  Admit date: 08/19/2012 Discharge date: 08/22/2012  Admission Diagnoses: Other and unspecified ovarian cyst  Discharge Diagnoses:  Principal Problem:   Complex ovarian cyst  Discharged Condition: The patient is in good condition and stable for discharge.  Hospital Course:  On 08/19/2012, the patient underwent the following: Procedure(s):  EXPLORATORY LAPAROTOMY, RIGHT SALPINGOOOPHORECTOMY,  MYOMECTOMY.  The postoperative course was uneventful.  She was discharged to home on postoperative day 3 tolerating a regular diet and passing flatus.  Consults: None  Significant Diagnostic Studies: None  Treatments: surgery: see above  Discharge Exam: Blood pressure 107/74, pulse 79, temperature 97.9 F (36.6 C), temperature source Oral, resp. rate 16, height 5\' 1"  (1.549 m), weight 134 lb 1 oz (60.81 kg), last menstrual period 07/11/2012, SpO2 100.00%. General appearance: alert, cooperative and no distress Resp: clear to auscultation bilaterally Cardio: regular rate and rhythm, S1, S2 normal, no murmur, click, rub or gallop GI: soft, non-tender; bowel sounds normal; no masses,  no organomegaly Extremities: extremities normal, atraumatic, no cyanosis or edema Incision/Wound: Midline incision with steri-strips clean, dry, and intact  Disposition: 01-Home or Self Care  Discharge Orders   Future Appointments Provider Department Dept Phone   09/05/2012 10:45 AM Doylene Bode, NP [facility] [phone]   10/09/2012 3:45 PM Paola A. Duard Brady, MD [facility] [phone]   Future Orders Complete By Expires     Call MD for:  difficulty breathing, headache or visual disturbances  As directed     Call MD for:  extreme fatigue  As directed     Call MD for:  hives  As directed     Call MD for:  persistant dizziness or  light-headedness  As directed     Call MD for:  persistant nausea and vomiting  As directed     Call MD for:  redness, tenderness, or signs of infection (pain, swelling, redness, odor or green/yellow discharge around incision site)  As directed     Call MD for:  severe uncontrolled pain  As directed     Call MD for:  temperature >100.4  As directed     Diet - low sodium heart healthy  As directed     Driving Restrictions  As directed     Comments:      No driving for 1-2 weeks.  Do not take narcotics and drive.    Increase activity slowly  As directed     Lifting restrictions  As directed     Comments:      No lifting greater than 10 lbs.    Sexual Activity Restrictions  As directed     Comments:      No sexual activity, nothing in the vagina, for 8 weeks.        Medication List    STOP taking these medications       metroNIDAZOLE 500 MG tablet  Commonly known as:  FLAGYL      TAKE these medications       ibuprofen 800 MG tablet  Commonly known as:  ADVIL,MOTRIN  Take 1 tablet (800 mg total) by mouth every 8 (eight) hours as needed for pain.     oxyCODONE-acetaminophen 5-325 MG per tablet  Commonly known as:  PERCOCET/ROXICET  Take 1-2 tablets by mouth every 4 (four) hours as needed (severe pain).     pseudoephedrine-acetaminophen 30-500 MG Tabs  Commonly known as:  TYLENOL SINUS  Take 1 tablet by mouth every 4 (four) hours as needed (cold/congestion).           Follow-up Information   Follow up with Carnisha Feltz DEAL, NP On 09/05/2012. (at 10:45am at the [facility])    Contact information:   [address] [place] [address] [phone]       Follow up with Cleda Mccreedy A., MD On 10/09/2012. (at 3:45pm at the [facility])    Contact information:   [address] [place] [address] [phone]       Signed: Ayvion Kavanagh DEAL 08/22/2012, 9:52 AM

## 2012-08-28 ENCOUNTER — Telehealth: Payer: Self-pay | Admitting: Gynecologic Oncology

## 2012-08-28 NOTE — Telephone Encounter (Signed)
Post op telephone call to check patient status.  Patient describes expected post operative status.  Adequate PO intake reported.  Bowels and bladder functioning without difficulty.  Pain minimal.  Reportable signs and symptoms reviewed.  Follow up appt arranged.   

## 2012-09-05 ENCOUNTER — Ambulatory Visit: Payer: BC Managed Care – PPO | Attending: Gynecologic Oncology | Admitting: Gynecologic Oncology

## 2012-09-05 ENCOUNTER — Encounter: Payer: Self-pay | Admitting: Gynecologic Oncology

## 2012-09-05 VITALS — BP 120/69 | HR 71 | Temp 99.2°F | Resp 20 | Ht 61.46 in | Wt 127.4 lb

## 2012-09-05 DIAGNOSIS — Z79899 Other long term (current) drug therapy: Secondary | ICD-10-CM | POA: Insufficient documentation

## 2012-09-05 DIAGNOSIS — Z09 Encounter for follow-up examination after completed treatment for conditions other than malignant neoplasm: Secondary | ICD-10-CM | POA: Insufficient documentation

## 2012-09-05 DIAGNOSIS — N838 Other noninflammatory disorders of ovary, fallopian tube and broad ligament: Secondary | ICD-10-CM

## 2012-09-05 DIAGNOSIS — N83209 Unspecified ovarian cyst, unspecified side: Secondary | ICD-10-CM | POA: Insufficient documentation

## 2012-09-05 DIAGNOSIS — D259 Leiomyoma of uterus, unspecified: Secondary | ICD-10-CM | POA: Insufficient documentation

## 2012-09-05 MED ORDER — TRAMADOL HCL 50 MG PO TABS: 50.0000 mg | ORAL_TABLET | Freq: Four times a day (QID) | ORAL | Status: DC | PRN

## 2012-09-05 NOTE — Progress Notes (Signed)
Follow Up Note: Gyn-Onc  Dawn Mayo 27 y.o. female  CC:  Chief Complaint  Patient presents with  . Ovarian mass    Follow up    HPI:  Dawn Mayo is a 27 year old female referred to Gynecologic Oncology for a 15.1 cm complex right adnexal mass and normal CA 125.  On 08/19/12, she underwent an exploratory laparotomy, right salpingo-oophorectomy, uterine myomectomy x2 by Dr. Duard Brady.  Final pathology revealed 1. Ovary and fallopian tube, right - BENIGN MULTILOCULAR CYST WITH FOCAL STROMA OVARII. SEE MICROSCOPIC DESCRIPTION. - UNREMARKABLE FALLOPIAN TUBE.  2. Fibroid - LEIOMYOMA.  Her post-operative course was uneventful.    Interval History:  She presents today for post operative follow up.  She describes expected post operative status.  Adequate PO intake reported.  Bowels and bladder functioning without difficulty.  Pain minimal with no use of pain medication required.  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  She reports "feeling sick" after taking Percocet at home after discharge and has not taken them since.  She reports minimal pain relief with ibuprofen use.  She is agreeable to trying Tramadol for pain.  She reports no drainage or redness with her abdominal incision.  Reports one episode of burning with urination last pm but no complaints this am.  No concerns voiced.    Review of Systems  Constitutional: Feels well.  No fever, chills, or fatigue.  Cardiovascular: No chest pain, shortness of breath, or edema.  Pulmonary: No cough or wheeze.  Gastrointestinal: No nausea, vomiting, or diarrhea. No bright red blood per rectum or change in bowel movement.  Genitourinary: No frequency, urgency,  hematuria, or dysuria at this time. No vaginal bleeding or discharge.  Musculoskeletal: No myalgia or joint pain. Neurologic: No weakness, numbness, or change in gait.  Psychology: No depression, anxiety, or insomnia.   Current Meds:  Outpatient Encounter Prescriptions as of 09/05/2012  Medication Sig Dispense Refill  . ibuprofen (ADVIL,MOTRIN) 800 MG tablet Take 1 tablet (800 mg total) by mouth every 8 (eight) hours as needed for pain.  30 tablet  0  . oxyCODONE-acetaminophen (PERCOCET/ROXICET) 5-325 MG per tablet Take 1-2 tablets by mouth every 4 (four) hours as needed (severe pain).  60 tablet  0  . pseudoephedrine-acetaminophen (TYLENOL SINUS) 30-500 MG TABS Take 1 tablet by mouth every 4 (four) hours as needed (cold/congestion).      . traMADol (ULTRAM) 50 MG tablet Take 1 tablet (50 mg total) by mouth every 6 (six) hours as needed for pain.  30 tablet  0  . [DISCONTINUED] traMADol (ULTRAM) 50 MG tablet Take 50 mg by mouth every 6 (six) hours as needed for pain.       No facility-administered encounter medications on file as of 09/05/2012.    Allergy: No Known Allergies  Social Hx:   History   Social History  . Marital Status: Single    Spouse Name: N/A    Number of Children: N/A  . Years of Education: N/A   Occupational History  . Not on file.   Social History Main Topics  . Smoking status: Never Smoker   . Smokeless tobacco: Not on file  . Alcohol Use: No  . Drug Use: No  . Sexually Active: Not Currently  Birth Control/ Protection: None   Other Topics Concern  . Not on file   Social History Narrative  . No narrative on file    Past Surgical Hx:  Past Surgical History  Procedure Laterality Date  . Dilation and curettage of uterus    . Laparotomy Bilateral 08/19/2012    Procedure: EXPLORATORY LAPAROTOMY, RIGHT SALPINGOOOPHORECTOMY,  MYOMECTOMY;  Surgeon: Rejeana Brock A. Duard Brady, MD;  Location: [facility];  Service: Gynecology;  Laterality: Bilateral;    Past Medical Hx:   Past Medical History  Diagnosis Date  . Fibroids   . Ovarian cyst   . Asthma     AS CHILD  . Eczema   . Anemia     Family Hx: History reviewed. No pertinent family history.  Vitals:  Blood pressure 120/69, pulse 71, temperature 99.2 F (37.3 C), temperature source Oral, resp. rate 20, height 5' 1.46" (1.561 m), weight 127 lb 6.4 oz (57.788 kg), last menstrual period 08/31/2012.  Physical Exam:  General: Well developed, well nourished female in no acute distress. Alert and oriented x 3.  Cardiovascular: Regular rate and rhythm. S1 and S2 normal.  Lungs: Clear to auscultation bilaterally. No wheezes/crackles/rhonchi noted.  Skin: No rashes or lesions present. Back: No CVA tenderness.  Abdomen: Abdomen soft, non-tender and non-obese. Active bowel sounds in all quadrants.  Midline incision starting below the umbilicus well healed with no evidence of erythema, drainage, or herniation.  Extremities: No bilateral cyanosis, edema, or clubbing.   Assessment/Plan:  Dawn Mayo is a 27 year old status post exploratory laparotomy, right salpingo-oophorectomy, uterine myomectomy x2 by Dr. Duard Brady on 08/19/12.  Final pathology revealed 1. Ovary and fallopian tube, right - BENIGN MULTILOCULAR CYST WITH FOCAL STROMA OVARII. SEE MICROSCOPIC DESCRIPTION. - UNREMARKABLE FALLOPIAN TUBE.  2. Fibroid - LEIOMYOMA.  She is to follow up with Dr. Duard Brady on 10/19/12 for post-operative follow up and examination.  Reportable signs and symptoms reviewed.  She is advised to call for any questions or concerns.  She is advised to call the office if dysuria returns.  Tramadol sent electronically to pharmacy and information on medication given.     CROSS, MELISSA DEAL, NP 09/05/2012, 4:04 PM

## 2012-09-05 NOTE — Patient Instructions (Signed)
Doing well.  Follow up as scheduled.  Take Tramadol as needed.  Please call for any questions or concerns.  Tramadol tablets What is this medicine? TRAMADOL (TRA ma dole) is a pain reliever. It is used to treat moderate to severe pain in adults. This medicine may be used for other purposes; ask your health care provider or pharmacist if you have questions. What should I tell my health care provider before I take this medicine? They need to know if you have any of these conditions: -brain tumor -depression -drug abuse or addiction -head injury -if you frequently drink alcohol containing drinks -kidney disease or trouble passing urine -liver disease -lung disease, asthma, or breathing problems -seizures or epilepsy -suicidal thoughts, plans, or attempt; a previous suicide attempt by you or a family member -an unusual or allergic reaction to tramadol, codeine, other medicines, foods, dyes, or preservatives -pregnant or trying to get pregnant -breast-feeding How should I use this medicine? Take this medicine by mouth with a full glass of water. Follow the directions on the prescription label. If the medicine upsets your stomach, take it with food or milk. Do not take more medicine than you are told to take. Talk to your pediatrician regarding the use of this medicine in children. Special care may be needed. Overdosage: If you think you have taken too much of this medicine contact a poison control center or emergency room at once. NOTE: This medicine is only for you. Do not share this medicine with others. What if I miss a dose? If you miss a dose, take it as soon as you can. If it is almost time for your next dose, take only that dose. Do not take double or extra doses. What may interact with this medicine? Do not take this medicine with any of the following medications: -MAOIs like Carbex, Eldepryl, Marplan, Nardil, and Parnate This medicine may also interact with the following  medications: -alcohol or medicines that contain alcohol -antihistamines -benzodiazepines -bupropion -carbamazepine or oxcarbazepine -clozapine -cyclobenzaprine -digoxin -furazolidone -linezolid -medicines for depression, anxiety, or psychotic disturbances -medicines for migraine headache like almotriptan, eletriptan, frovatriptan, naratriptan, rizatriptan, sumatriptan, zolmitriptan -medicines for pain like pentazocine, buprenorphine, butorphanol, meperidine, nalbuphine, and propoxyphene -medicines for sleep -muscle relaxants -naltrexone -phenobarbital -phenothiazines like perphenazine, thioridazine, chlorpromazine, mesoridazine, fluphenazine, prochlorperazine, promazine, and trifluoperazine -procarbazine -warfarin This list may not describe all possible interactions. Give your health care provider a list of all the medicines, herbs, non-prescription drugs, or dietary supplements you use. Also tell them if you smoke, drink alcohol, or use illegal drugs. Some items may interact with your medicine. What should I watch for while using this medicine? Tell your doctor or health care professional if your pain does not go away, if it gets worse, or if you have new or a different type of pain. You may develop tolerance to the medicine. Tolerance means that you will need a higher dose of the medicine for pain relief. Tolerance is normal and is expected if you take this medicine for a long time. Do not suddenly stop taking your medicine because you may develop a severe reaction. Your body becomes used to the medicine. This does NOT mean you are addicted. Addiction is a behavior related to getting and using a drug for a non-medical reason. If you have pain, you have a medical reason to take pain medicine. Your doctor will tell you how much medicine to take. If your doctor wants you to stop the medicine, the dose will be slowly lowered  over time to avoid any side effects. You may get drowsy or dizzy. Do  not drive, use machinery, or do anything that needs mental alertness until you know how this medicine affects you. Do not stand or sit up quickly, especially if you are an older patient. This reduces the risk of dizzy or fainting spells. Alcohol can increase or decrease the effects of this medicine. Avoid alcoholic drinks. You may have constipation. Try to have a bowel movement at least every 2 to 3 days. If you do not have a bowel movement for 3 days, call your doctor or health care professional. Your mouth may get dry. Chewing sugarless gum or sucking hard candy, and drinking plenty of water may help. Contact your doctor if the problem does not go away or is severe. What side effects may I notice from receiving this medicine? Side effects that you should report to your doctor or health care professional as soon as possible: -allergic reactions like skin rash, itching or hives, swelling of the face, lips, or tongue -breathing difficulties, wheezing -confusion -itching -light headedness or fainting spells -redness, blistering, peeling or loosening of the skin, including inside the mouth -seizures Side effects that usually do not require medical attention (report to your doctor or health care professional if they continue or are bothersome): -constipation -dizziness -drowsiness -headache -nausea, vomiting This list may not describe all possible side effects. Call your doctor for medical advice about side effects. You may report side effects to FDA at 1-800-FDA-1088. Where should I keep my medicine? Keep out of the reach of children. Store at room temperature between 15 and 30 degrees C (59 and 86 degrees F). Keep container tightly closed. Throw away any unused medicine after the expiration date. NOTE: This sheet is a summary. It may not cover all possible information. If you have questions about this medicine, talk to your doctor, pharmacist, or health care provider.  2012, [employer]. (11/30/2009 11:55:44 AM)

## 2012-10-09 ENCOUNTER — Encounter: Payer: Self-pay | Admitting: Gynecologic Oncology

## 2012-10-09 ENCOUNTER — Ambulatory Visit: Payer: BC Managed Care – PPO | Attending: Gynecologic Oncology | Admitting: Gynecologic Oncology

## 2012-10-09 VITALS — BP 102/60 | HR 90 | Temp 98.5°F | Resp 16 | Ht 61.42 in | Wt 127.6 lb

## 2012-10-09 DIAGNOSIS — N838 Other noninflammatory disorders of ovary, fallopian tube and broad ligament: Secondary | ICD-10-CM

## 2012-10-09 DIAGNOSIS — D259 Leiomyoma of uterus, unspecified: Secondary | ICD-10-CM | POA: Insufficient documentation

## 2012-10-09 DIAGNOSIS — N83209 Unspecified ovarian cyst, unspecified side: Secondary | ICD-10-CM | POA: Insufficient documentation

## 2012-10-09 NOTE — Progress Notes (Signed)
Consult Note: Gyn-Onc  Dawn Mayo 27 y.o. female  CC:  Chief Complaint  Patient presents with  . Ovarian mass    Follow up     HPI:  Patient is a 27 year old which was a 15.1 cm complex right adnexal mass and a normal CA 125.  07/01/12 CT Abdomen and pelvis Findings: The liver, spleen, pancreas, adrenal glands, kidneys are within normal limits. There is a large 9.7 x 15.1 x 12.7 cm complex solid and cystic mass emanating from the right adnexa. There are no definite fatty elements or calcified elements. 2.1 x 3.2 cm avidly enhancing and heterogeneous pedunculated fibroid from the left side of the fundus is noted. Irregular heterogeneous complex cystic lesion within the left adnexa measures 2.0 cm and likely represents an involuting hemorrhagic cyst. Trace free fluid is present in the pelvis.   07/01/12 Pelvic ultrasound The right complex adnexal mass demonstrates a solid component measuring 5.0 x 5.0 by 5.2 cm which contains some internal flow with color Doppler exam as well as a slightly thickened septation measuring 3.5 mm. More normal appearing ovarian tissue is seen at this periphery of this lesion suggesting this is ovarian in etiology. This could represent a dermoid but the appearance is not typical and the solid component would be considered a worrisome feature. A malignant neoplastic process such as a serous cystadenocarcinoma or borderline tumor cannot be excluded. CA 125 was normal at 14.  Interval History:  On May 22,2014 show exploratory laparotomy, right salpingo-oophorectomy, uterine myomectomy. Operative findings included a 15 cm right ovarian mass that completely replaced the right ovary with no normal right a very identifiable. There is a normal left adnexa. Is a 3 x 4 cm pedunculated uterine myoma near the left cornua was a small 1 x 1 cm pedunculated myoma. Frozen section revealed a serous cystadenoma. Abdominal survey was otherwise negative.   Final pathology revealed a  benign multiloculated cyst with focal stroma ovarian I, unremarkable fallopian tube, benign leiomyomas. She comes in today for her postoperative check. She's overall doing very well. She is back to work. She works full-time and doesTeacher, music. She has had a menstrual cycle since her surgery was very short lasting 3 days which makes her quite happy. She has some abdominal tenderness. She's been precipitated then had some pain but was not unbearable.    Current Meds:  Outpatient Encounter Prescriptions as of 10/09/2012  Medication Sig Dispense Refill  . ibuprofen (ADVIL,MOTRIN) 800 MG tablet Take 1 tablet (800 mg total) by mouth every 8 (eight) hours as needed for pain.  30 tablet  0  . oxyCODONE-acetaminophen (PERCOCET/ROXICET) 5-325 MG per tablet Take 1-2 tablets by mouth every 4 (four) hours as needed (severe pain).  60 tablet  0  . pseudoephedrine-acetaminophen (TYLENOL SINUS) 30-500 MG TABS Take 1 tablet by mouth every 4 (four) hours as needed (cold/congestion).      . traMADol (ULTRAM) 50 MG tablet Take 1 tablet (50 mg total) by mouth every 6 (six) hours as needed for pain.  30 tablet  0   No facility-administered encounter medications on file as of 10/09/2012.    Allergy: No Known Allergies  Social Hx:   History   Social History  . Marital Status: Single    Spouse Name: N/A    Number of Children: N/A  . Years of Education: N/A   Occupational History  . Not on file.   Social History Main Topics  . Smoking status: Never Smoker   . Smokeless  tobacco: Not on file  . Alcohol Use: No  . Drug Use: No  . Sexually Active: Not Currently    Birth Control/ Protection: None   Other Topics Concern  . Not on file   Social History Narrative  . No narrative on file    Past Surgical Hx:  Past Surgical History  Procedure Laterality Date  . Dilation and curettage of uterus    . Laparotomy Bilateral 08/19/2012    Procedure: EXPLORATORY LAPAROTOMY, RIGHT SALPINGOOOPHORECTOMY,   MYOMECTOMY;  Surgeon: Rejeana Brock A. Duard Brady, MD;  Location: [facility];  Service: Gynecology;  Laterality: Bilateral;    Past Medical Hx:  Past Medical History  Diagnosis Date  . Fibroids   . Ovarian cyst   . Asthma     AS CHILD  . Eczema   . Anemia     Oncology Hx:   No history exists.    Family Hx: No family history on file.  Vitals:  Blood pressure 102/60, pulse 90, temperature 98.5 F (36.9 C), temperature source Oral, resp. rate 16, height 5' 1.42" (1.56 m), weight 127 lb 9.6 oz (57.879 kg).  Physical Exam: Well-nourished well-developed female in no acute distress.  Abdomen: Well-healed vertical midline incision. Abdomen is soft, nontender, nondistended.  Extremities: No edema  Pelvic: Normal external female genitalia. Bimanual examination there is no cervical motion tenderness. The uterus is of normal size shape and consistency. There is no appreciable adnexal masses.  Assessment/Plan:  [name] is a 27 y.o. year old with a 15.1 cm complex right adnexal mass and normal CA125. The left adnexa suspicious for an abnormal appearing ovarian cysts. Her pathology was benign. She'll followup with Dr. Shawnie Pons for her routine gynecologic care.  Ndrew Creason A., MD 10/09/2012, 3:31 PM

## 2013-02-05 ENCOUNTER — Other Ambulatory Visit: Payer: Self-pay

## 2014-02-01 ENCOUNTER — Encounter: Payer: Self-pay | Admitting: Gynecologic Oncology

## 2014-08-18 ENCOUNTER — Ambulatory Visit (INDEPENDENT_AMBULATORY_CARE_PROVIDER_SITE_OTHER): Payer: BLUE CROSS/BLUE SHIELD | Admitting: Sports Medicine

## 2014-08-18 VITALS — BP 122/70 | HR 60 | Temp 98.0°F | Resp 16 | Ht 61.0 in | Wt 146.8 lb

## 2014-08-18 DIAGNOSIS — L0231 Cutaneous abscess of buttock: Secondary | ICD-10-CM

## 2014-08-18 NOTE — Progress Notes (Signed)
  Dawn Mayo - 29 y.o. female MRN [id number]  Date of birth: 1985/10/09  SUBJECTIVE:  Including CC & ROS.  patient C/O: large nodule on right buttock Onset of symptoms: 2 days ago Symptoms:  Mild Red, painful, itchy, no drainage, picked a scab,  no previous history of abscess Relieving factors: nothing Worsened by: no different no worse   ROS:  Constitutional:  No fever, chills, or fatigue.  Respiratory:  No shortness of breath, cough, or wheezing Cardiovascular:  No palpitations, chest pain or syncope Gastrointestinal:  No nausea, no abdominal pain Review of systems otherwise negative except for what is stated in HPI  HISTORY: Past Medical, Surgical, Social, and Family History Reviewed & Updated per EMR. Pertinent Historical Findings include: OCP No medical problem No exposure  No new sexual partner  PHYSICAL EXAM:  VS: BP:122/70 mmHg  HR:60bpm  TEMP:98 F (36.7 C)(Oral)  RESP:98 %  HT:5\' 1"  (154.9 cm)   WT:146 lb 12.8 oz (66.588 kg)  BMI:27.8 PHYSICAL EXAM: General:  Alert and oriented, No acute distress.   HENT:  Normocephalic, Oral mucosa is moist.   Respiratory:  Lungs are clear to auscultation, Respirations are non-labored, Symmetrical chest wall expansion.   Cardiovascular:  Normal rate, Regular rhythm, No murmur, Good pulses equal in all extremities, No edema.   Gastrointestinal:  Soft, Non-tender, Non-distended, Normal bowel sounds, No organomegaly.   Integumentary:  Warm, Dry, No rash.  She has a very small half centimeter area of induration consistent with abscess. Minimal surrounding erythema, mild tenderness to palpation, no drainage, scab is in place. Neurologic:  Alert, Oriented, No focal defects Psychiatric:  Cooperative, Appropriate mood & affect.    ASSESSMENT & PLAN:  Impression: Small abscess less than 1 cm on the right buttocks  Recommendations: -Recommended incision and drainage of abscess along with culture to direct treatment if  needed. -Abscess a small enough the treatment will be sufficed with incision and drainage. -Patient does grow out MRSA would consider possible treatment with Hibiclens, and antibiotic ointment to the nasal passage. -Patient will follow-up in 48 hours for packing removal  Wound procedure: Date: 08/18/14  Confirmed: patient, side, site, safety procedures followed.    Performed by: Birdie Hopes MARIE DO.    Informed consent:verbalized by patient  Indication: right buttock abscess Location: right buttocks  Preparation and technique: skin prepped with soap and water, sterile preparation of site (with draped to expose affected area, eye protection, gloves and mask), local anesthesia 1% Lidocaine with epi epinephrine 3  Ml used, wound irrigation with low pressure.    Wound assessment:: characteristics (erythematous,minimal purulent), discharge (bloody yes, yellow-colored no), inflammatory changes (redness minimal, swelling minimal), pain throbbing mild    Procedure tolerated: fairly.

## 2014-08-21 ENCOUNTER — Ambulatory Visit (INDEPENDENT_AMBULATORY_CARE_PROVIDER_SITE_OTHER): Payer: BLUE CROSS/BLUE SHIELD | Admitting: Physician Assistant

## 2014-08-21 VITALS — BP 110/70 | HR 71 | Temp 98.8°F | Ht 61.0 in | Wt 146.0 lb

## 2014-08-21 DIAGNOSIS — L0231 Cutaneous abscess of buttock: Secondary | ICD-10-CM

## 2014-08-21 NOTE — Progress Notes (Signed)
   Subjective:    Patient ID: Dawn Mayo, female    DOB: 05/11/1985, 29 y.o.   MRN: [id number]  HPI Pt presents to clinic for wound check.  The area is slightly painful.  She has not changed the drsg even after showering.  She is using warm compresses.  Review of Systems  Constitutional: Negative for fever and chills.  Gastrointestinal: Negative for nausea.  Skin: Positive for wound. Negative for rash.       Objective:   Physical Exam  Constitutional: She is oriented to person, place, and time. She appears well-developed and well-nourished.  BP 110/70 mmHg  Pulse 71  Temp(Src) 98.8 F (37.1 C) (Oral)  Ht 5\' 1"  (1.549 m)  Wt 146 lb (66.225 kg)  BMI 27.60 kg/m2  SpO2 98%  LMP 08/20/2014   HENT:  Head: Normocephalic and atraumatic.  Right Ear: External ear normal.  Left Ear: External ear normal.  Eyes: Conjunctivae are normal.  Pulmonary/Chest: Effort normal.  Neurological: She is alert and oriented to person, place, and time.  Skin: Skin is warm and dry.  Packing removed.  No surrounding erythema.  No purulence expressed.  Good granulation tissue.  No surrounding induration.  Psychiatric: She has a normal mood and affect. Her behavior is normal. Judgment and thought content normal.       Assessment & Plan:  Abscess of buttock, right Continue drsg until healed over.  The wound looks good and wound culture is not back yet but I think no abx are appropriate to continue for the patient at this time.  Windell Hummingbird PA-C  [facility] 08/21/2014 10:06 AM

## 2014-08-21 NOTE — Patient Instructions (Signed)
warm compresses Keep it covered until it is healed over

## 2014-08-22 LAB — WOUND CULTURE
Gram Stain: NONE SEEN
Gram Stain: NONE SEEN

## 2018-04-28 DIAGNOSIS — Z3490 Encounter for supervision of normal pregnancy, unspecified, unspecified trimester: Secondary | ICD-10-CM | POA: Insufficient documentation

## 2018-04-28 HISTORY — DX: Encounter for supervision of normal pregnancy, unspecified, unspecified trimester: Z34.90

## 2018-04-29 ENCOUNTER — Other Ambulatory Visit: Payer: Self-pay

## 2018-04-29 ENCOUNTER — Ambulatory Visit (INDEPENDENT_AMBULATORY_CARE_PROVIDER_SITE_OTHER): Payer: Medicaid Other | Admitting: Obstetrics & Gynecology

## 2018-04-29 ENCOUNTER — Encounter: Payer: Self-pay | Admitting: Obstetrics & Gynecology

## 2018-04-29 ENCOUNTER — Other Ambulatory Visit (HOSPITAL_COMMUNITY)
Admission: RE | Admit: 2018-04-29 | Discharge: 2018-04-29 | Disposition: A | Discharge status: Home / Self Care | Payer: Medicaid Other | Source: Ambulatory Visit | Attending: Obstetrics & Gynecology | Admitting: Obstetrics & Gynecology

## 2018-04-29 VITALS — BP 115/74 | HR 94 | Ht 61.0 in | Wt 119.0 lb

## 2018-04-29 DIAGNOSIS — A599 Trichomoniasis, unspecified: Secondary | ICD-10-CM

## 2018-04-29 DIAGNOSIS — Z3482 Encounter for supervision of other normal pregnancy, second trimester: Secondary | ICD-10-CM | POA: Diagnosis not present

## 2018-04-29 DIAGNOSIS — O09521 Supervision of elderly multigravida, first trimester: Secondary | ICD-10-CM

## 2018-04-29 DIAGNOSIS — O3429 Maternal care due to uterine scar from other previous surgery: Secondary | ICD-10-CM | POA: Insufficient documentation

## 2018-04-29 DIAGNOSIS — Z3490 Encounter for supervision of normal pregnancy, unspecified, unspecified trimester: Secondary | ICD-10-CM

## 2018-04-29 DIAGNOSIS — O09529 Supervision of elderly multigravida, unspecified trimester: Secondary | ICD-10-CM | POA: Insufficient documentation

## 2018-04-29 DIAGNOSIS — O3411 Maternal care for benign tumor of corpus uteri, first trimester: Secondary | ICD-10-CM

## 2018-04-29 DIAGNOSIS — Z3491 Encounter for supervision of normal pregnancy, unspecified, first trimester: Secondary | ICD-10-CM

## 2018-04-29 DIAGNOSIS — D259 Leiomyoma of uterus, unspecified: Secondary | ICD-10-CM

## 2018-04-29 DIAGNOSIS — O3410 Maternal care for benign tumor of corpus uteri, unspecified trimester: Secondary | ICD-10-CM

## 2018-04-29 NOTE — Progress Notes (Signed)
  Subjective:    Dawn Mayo is being seen today for her first obstetrical visit. [id number] LMP 01/29/2018 (sure) This is a planned unpregnancy. She is at [redacted]w[redacted]d gestation. Her obstetrical history is significant for advanced maternal age. Relationship with FOB: spouse, living together. Patient does intend to breast feed. Pregnancy history fully reviewed.  Patient reports no complaints.  Review of Systems:   Review of Systems [place] right salping-oophorectomy 2015   Objective:     BP 115/74   Pulse 94   Ht 5\' 1"  (1.549 m)   Wt 119 lb (54 kg)   LMP 01/29/2018 (Exact Date)   BMI 22.48 kg/m  Physical Exam  Exam General Appearance:    Alert, cooperative, no distress, appears stated age  Head:    Normocephalic, without obvious abnormality, atraumatic  Eyes:    conjunctiva/corneas clear, EOM's intact, both eyes  Ears:    Normal external ear canals, both ears  Nose:   Nares normal, septum midline, mucosa normal, no drainage    or sinus tenderness  Throat:   Lips, mucosa, and tongue normal; teeth and gums normal  Neck:   Supple, symmetrical, trachea midline, no adenopathy;    thyroid:  no enlargement/tenderness/nodules  Back:     Symmetric, no curvature, ROM normal, no CVA tenderness  Lungs:     Clear to auscultation bilaterally, respirations unlabored  Chest Wall:    No tenderness or deformity   Heart:    Regular rate and rhythm, S1 and S2 normal, no murmur, rub   or gallop  Breast Exam:    No tenderness, masses, or nipple abnormality  Abdomen:     Soft, non-tender, bowel sounds active all four quadrants,    no masses, no organomegaly; uterus enlarged and a3 cm below umbilicus   Genitalia:    Normal female without lesion, discharge or tenderness; 16 weeks sized; fibroids palpated; mobile .      Extremities:   Extremities normal, atraumatic, no cyanosis or edema  Pulses:   2+ and symmetric all extremities  Skin:   Skin color, texture, turgor normal, no rashes or lesions      Assessment:     Pregnancy: G1P0 Patient Active Problem List   Diagnosis Date Noted  . Antepartum multigravida of advanced maternal age 70/28/2020  . Uterine scar from previous surgery affecting pregnancy 04/29/2018  . Uterine fibroids affecting pregnancy 04/29/2018  . Supervision of normal pregnancy, antepartum 04/28/2018       Plan:     Initial labs drawn. Prenatal vitamins. Problem list reviewed and updated. AFP3 discussed: requested. Role of ultrasound in pregnancy discussed; fetal survey: requested. Amniocentesis discussed: not indicated. Follow up in 4 weeks. 60% of 50 min visit spent on counseling and coordination of care.  Flu shot today Reviewed prev OP notes Reviewed with pt risks assoc with fibroid uteris in pregnancy and delviery.     Lavonia Drafts 04/29/2018

## 2018-04-29 NOTE — Patient Instructions (Signed)
Uterine Fibroids  Uterine fibroids (leiomyomas) are noncancerous (benign) tumors that can develop in the uterus. Fibroids may also develop in the fallopian tubes, cervix, or tissues (ligaments) near the uterus. You may have one or many fibroids. Fibroids vary in size, weight, and where they grow in the uterus. Some can become quite large. Most fibroids do not require medical treatment. What are the causes? The cause of this condition is not known. What increases the risk? You are more likely to develop this condition if you:  Are in your 30s or 40s and have not gone through menopause.  Have a family history of this condition.  Are of African-American descent.  Had your first period at an early age (early menarche).  Have not had any children (nulliparity).  Are overweight or obese. What are the signs or symptoms? Many women do not have any symptoms. Symptoms of this condition may include:  Heavy menstrual bleeding.  Bleeding or spotting between periods.  Pain and pressure in the pelvic area, between the hips.  Bladder problems, such as needing to urinate urgently or more often than usual.  Inability to have children (infertility).  Failure to carry pregnancy to term (miscarriage). How is this diagnosed? This condition may be diagnosed based on:  Your symptoms and medical history.  A physical exam.  A pelvic exam that includes feeling for any tumors.  Imaging tests, such as ultrasound or MRI. How is this treated? Treatment for this condition may include:  Seeing your health care provider for follow-up visits to monitor your fibroids for any changes.  Taking NSAIDs such as ibuprofen, naproxen, or aspirin to reduce pain.  Hormone medicines. These may be taken as a pill, given in an injection, or delivered by a T-shaped device that is inserted into the uterus (intrauterine device, IUD).  Surgery to remove one of the following: ? The fibroids (myomectomy). Your health  care provider may recommend this if fibroids affect your fertility and you want to become pregnant. ? The uterus (hysterectomy). ? Blood supply to the fibroids (uterine artery embolization). Follow these instructions at home:  Take over-the-counter and prescription medicines only as told by your health care provider.  Ask your health care provider if you should take iron pills or eat more iron-rich foods, such as dark green, leafy vegetables. Heavy menstrual bleeding can cause low iron levels.  If directed, apply heat to your back or abdomen to reduce pain. Use the heat source that your health care provider recommends, such as a moist heat pack or a heating pad. ? Place a towel between your skin and the heat source. ? Leave the heat on for 20-30 minutes. ? Remove the heat if your skin turns bright red. This is especially important if you are unable to feel pain, heat, or cold. You may have a greater risk of getting burned.  Pay close attention to your menstrual cycle. Tell your health care provider about any changes, such as: ? Increased blood flow that requires you to use more pads or tampons than usual. ? A change in the number of days that your period lasts. ? A change in symptoms that are associated with your period, such as back pain or cramps in your abdomen.  Keep all follow-up visits as told by your health care provider. This is important, especially if your fibroids need to be monitored for any changes. Contact a health care provider if you:  Have pelvic pain, back pain, or cramps in your abdomen that   do not get better with medicine or heat.  Develop new bleeding between periods.  Have increased bleeding during or between periods.  Feel unusually tired or weak.  Feel light-headed. Get help right away if you:  Faint.  Have pelvic pain that suddenly gets worse.  Have severe vaginal bleeding that soaks a tampon or pad in 30 minutes or less. Summary  Uterine fibroids are  noncancerous (benign) tumors that can develop in the uterus.  The exact cause of this condition is not known.  Most fibroids do not require medical treatment unless they affect your ability to have children (fertility).  Contact a health care provider if you have pelvic pain, back pain, or cramps in your abdomen that do not get better with medicines.  Make sure you know what symptoms should cause you to get help right away. This information is not intended to replace advice given to you by your health care provider. Make sure you discuss any questions you have with your health care provider. Document Released: 03/16/2000 Document Revised: 02/12/2017 Document Reviewed: 02/12/2017 Elsevier Interactive Patient Education  2019 [place] of Pregnancy The first trimester of pregnancy is from week 1 until the end of week 13 (months 1 through 3). A week after a sperm fertilizes an egg, the egg will implant on the wall of the uterus. This embryo will begin to develop into a baby. Genes from you and your partner will form the baby. The female genes will determine whether the baby will be a boy or a girl. At 6-8 weeks, the eyes and face will be formed, and the heartbeat can be seen on ultrasound. At the end of 12 weeks, all the baby's organs will be formed. Now that you are pregnant, you will want to do everything you can to have a healthy baby. Two of the most important things are to get good prenatal care and to follow your health care provider's instructions. Prenatal care is all the medical care you receive before the baby's birth. This care will help prevent, find, and treat any problems during the pregnancy and childbirth. Body changes during your first trimester Your body goes through many changes during pregnancy. The changes vary from woman to woman.  You may gain or lose a couple of pounds at first.  You may feel sick to your stomach (nauseous) and you may throw up (vomit). If  the vomiting is uncontrollable, call your health care provider.  You may tire easily.  You may develop headaches that can be relieved by medicines. All medicines should be approved by your health care provider.  You may urinate more often. Painful urination may mean you have a bladder infection.  You may develop heartburn as a result of your pregnancy.  You may develop constipation because certain hormones are causing the muscles that push stool through your intestines to slow down.  You may develop hemorrhoids or swollen veins (varicose veins).  Your breasts may begin to grow larger and become tender. Your nipples may stick out more, and the tissue that surrounds them (areola) may become darker.  Your gums may bleed and may be sensitive to brushing and flossing.  Dark spots or blotches (chloasma, mask of pregnancy) may develop on your face. This will likely fade after the baby is born.  Your menstrual periods will stop.  You may have a loss of appetite.  You may develop cravings for certain kinds of food.  You may have changes in your emotions  from day to day, such as being excited to be pregnant or being concerned that something may go wrong with the pregnancy and baby.  You may have more vivid and strange dreams.  You may have changes in your hair. These can include thickening of your hair, rapid growth, and changes in texture. Some women also have hair loss during or after pregnancy, or hair that feels dry or thin. Your hair will most likely return to normal after your baby is born. What to expect at prenatal visits During a routine prenatal visit:  You will be weighed to make sure you and the baby are growing normally.  Your blood pressure will be taken.  Your abdomen will be measured to track your baby's growth.  The fetal heartbeat will be listened to between weeks 10 and 14 of your pregnancy.  Test results from any previous visits will be discussed. Your health care  provider may ask you:  How you are feeling.  If you are feeling the baby move.  If you have had any abnormal symptoms, such as leaking fluid, bleeding, severe headaches, or abdominal cramping.  If you are using any tobacco products, including cigarettes, chewing tobacco, and electronic cigarettes.  If you have any questions. Other tests that may be performed during your first trimester include:  Blood tests to find your blood type and to check for the presence of any previous infections. The tests will also be used to check for low iron levels (anemia) and protein on red blood cells (Rh antibodies). Depending on your risk factors, or if you previously had diabetes during pregnancy, you may have tests to check for high blood sugar that affects pregnant women (gestational diabetes).  Urine tests to check for infections, diabetes, or protein in the urine.  An ultrasound to confirm the proper growth and development of the baby.  Fetal screens for spinal cord problems (spina bifida) and Down syndrome.  HIV (human immunodeficiency virus) testing. Routine prenatal testing includes screening for HIV, unless you choose not to have this test.  You may need other tests to make sure you and the baby are doing well. Follow these instructions at home: Medicines  Follow your health care provider's instructions regarding medicine use. Specific medicines may be either safe or unsafe to take during pregnancy.  Take a prenatal vitamin that contains at least 600 micrograms (mcg) of folic acid.  If you develop constipation, try taking a stool softener if your health care provider approves. Eating and drinking   Eat a balanced diet that includes fresh fruits and vegetables, whole grains, good sources of protein such as meat, eggs, or tofu, and low-fat dairy. Your health care provider will help you determine the amount of weight gain that is right for you.  Avoid raw meat and uncooked cheese. These  carry germs that can cause birth defects in the baby.  Eating four or five small meals rather than three large meals a day may help relieve nausea and vomiting. If you start to feel nauseous, eating a few soda crackers can be helpful. Drinking liquids between meals, instead of during meals, also seems to help ease nausea and vomiting.  Limit foods that are high in fat and processed sugars, such as fried and sweet foods.  To prevent constipation: ? Eat foods that are high in fiber, such as fresh fruits and vegetables, whole grains, and beans. ? Drink enough fluid to keep your urine clear or pale yellow. Activity  Exercise only as  directed by your health care provider. Most women can continue their usual exercise routine during pregnancy. Try to exercise for 30 minutes at least 5 days a week. Exercising will help you: ? Control your weight. ? Stay in shape. ? Be prepared for labor and delivery.  Experiencing pain or cramping in the lower abdomen or lower back is a good sign that you should stop exercising. Check with your health care provider before continuing with normal exercises.  Try to avoid standing for long periods of time. Move your legs often if you must stand in one place for a long time.  Avoid heavy lifting.  Wear low-heeled shoes and practice good posture.  You may continue to have sex unless your health care provider tells you not to. Relieving pain and discomfort  Wear a good support bra to relieve breast tenderness.  Take warm sitz baths to soothe any pain or discomfort caused by hemorrhoids. Use hemorrhoid cream if your health care provider approves.  Rest with your legs elevated if you have leg cramps or low back pain.  If you develop varicose veins in your legs, wear support hose. Elevate your feet for 15 minutes, 3-4 times a day. Limit salt in your diet. Prenatal care  Schedule your prenatal visits by the twelfth week of pregnancy. They are usually scheduled  monthly at first, then more often in the last 2 months before delivery.  Write down your questions. Take them to your prenatal visits.  Keep all your prenatal visits as told by your health care provider. This is important. Safety  Wear your seat belt at all times when driving.  Make a list of emergency phone numbers, including numbers for family, friends, the hospital, and police and fire departments. General instructions  Ask your health care provider for a referral to a local prenatal education class. Begin classes no later than the beginning of month 6 of your pregnancy.  Ask for help if you have counseling or nutritional needs during pregnancy. Your health care provider can offer advice or refer you to specialists for help with various needs.  Do not use hot tubs, steam rooms, or saunas.  Do not douche or use tampons or scented sanitary pads.  Do not cross your legs for long periods of time.  Avoid cat litter boxes and soil used by cats. These carry germs that can cause birth defects in the baby and possibly loss of the fetus by miscarriage or stillbirth.  Avoid all smoking, herbs, alcohol, and medicines not prescribed by your health care provider. Chemicals in these products affect the formation and growth of the baby.  Do not use any products that contain nicotine or tobacco, such as cigarettes and e-cigarettes. If you need help quitting, ask your health care provider. You may receive counseling support and other resources to help you quit.  Schedule a dentist appointment. At home, brush your teeth with a soft toothbrush and be gentle when you floss. Contact a health care provider if:  You have dizziness.  You have mild pelvic cramps, pelvic pressure, or nagging pain in the abdominal area.  You have persistent nausea, vomiting, or diarrhea.  You have a bad smelling vaginal discharge.  You have pain when you urinate.  You notice increased swelling in your face, hands,  legs, or ankles.  You are exposed to fifth disease or chickenpox.  You are exposed to [place] measles (rubella) and have never had it. Get help right away if:  You have a  fever.  You are leaking fluid from your vagina.  You have spotting or bleeding from your vagina.  You have severe abdominal cramping or pain.  You have rapid weight gain or loss.  You vomit blood or material that looks like coffee grounds.  You develop a severe headache.  You have shortness of breath.  You have any kind of trauma, such as from a fall or a car accident. Summary  The first trimester of pregnancy is from week 1 until the end of week 13 (months 1 through 3).  Your body goes through many changes during pregnancy. The changes vary from woman to woman.  You will have routine prenatal visits. During those visits, your health care provider will examine you, discuss any test results you may have, and talk with you about how you are feeling. This information is not intended to replace advice given to you by your health care provider. Make sure you discuss any questions you have with your health care provider. Document Released: 03/13/2001 Document Revised: 02/29/2016 Document Reviewed: 02/29/2016 Elsevier Interactive Patient Education  2019 [employer].

## 2018-04-29 NOTE — Progress Notes (Signed)
New Ob.  Declined FLU.

## 2018-04-30 ENCOUNTER — Encounter (HOSPITAL_COMMUNITY): Payer: Self-pay

## 2018-05-01 LAB — URINE CULTURE, OB REFLEX: ORGANISM ID, BACTERIA: NO GROWTH

## 2018-05-01 LAB — CULTURE, OB URINE

## 2018-05-02 LAB — CYTOLOGY - PAP
Bacterial vaginitis: POSITIVE — AB
Candida vaginitis: NEGATIVE
Chlamydia: NEGATIVE
Diagnosis: NEGATIVE
HPV (WINDOPATH): DETECTED — AB
Neisseria Gonorrhea: NEGATIVE
Trichomonas: POSITIVE — AB

## 2018-05-04 ENCOUNTER — Encounter: Payer: Self-pay | Admitting: Obstetrics & Gynecology

## 2018-05-04 ENCOUNTER — Other Ambulatory Visit: Payer: Self-pay | Admitting: Obstetrics & Gynecology

## 2018-05-04 DIAGNOSIS — A5901 Trichomonal vulvovaginitis: Secondary | ICD-10-CM | POA: Insufficient documentation

## 2018-05-04 DIAGNOSIS — B977 Papillomavirus as the cause of diseases classified elsewhere: Secondary | ICD-10-CM | POA: Insufficient documentation

## 2018-05-04 DIAGNOSIS — O23599 Infection of other part of genital tract in pregnancy, unspecified trimester: Secondary | ICD-10-CM

## 2018-05-04 MED ORDER — METRONIDAZOLE 500 MG PO TABS: ORAL_TABLET | ORAL | 0 refills | Status: DC

## 2018-05-04 NOTE — Addendum Note (Signed)
Addended by: Lavonia Drafts on: 05/04/2018 10:47 AM   Modules accepted: Orders

## 2018-05-08 ENCOUNTER — Encounter: Payer: Self-pay | Admitting: Obstetrics and Gynecology

## 2018-05-09 LAB — OBSTETRIC PANEL, INCLUDING HIV
Antibody Screen: NEGATIVE
Basophils Absolute: 0 10*3/uL (ref 0.0–0.2)
Basos: 0 %
EOS (ABSOLUTE): 1.1 10*3/uL — ABNORMAL HIGH (ref 0.0–0.4)
EOS: 9 %
HEP B S AG: NEGATIVE
HIV Screen 4th Generation wRfx: NONREACTIVE
Hematocrit: 35.1 % (ref 34.0–46.6)
Hemoglobin: 11.7 g/dL (ref 11.1–15.9)
Immature Grans (Abs): 0 10*3/uL (ref 0.0–0.1)
Immature Granulocytes: 0 %
Lymphocytes Absolute: 2.5 10*3/uL (ref 0.7–3.1)
Lymphs: 20 %
MCH: 28.5 pg (ref 26.6–33.0)
MCHC: 33.3 g/dL (ref 31.5–35.7)
MCV: 86 fL (ref 79–97)
Monocytes Absolute: 0.6 10*3/uL (ref 0.1–0.9)
Monocytes: 5 %
Neutrophils Absolute: 8.3 10*3/uL — ABNORMAL HIGH (ref 1.4–7.0)
Neutrophils: 66 %
Platelets: 428 10*3/uL (ref 150–450)
RBC: 4.1 x10E6/uL (ref 3.77–5.28)
RDW: 12.5 % (ref 11.7–15.4)
RPR: NONREACTIVE
RUBELLA: 1.65 {index} (ref >0.99)
Rh Factor: POSITIVE
WBC: 12.5 10*3/uL — ABNORMAL HIGH (ref 3.4–10.8)

## 2018-05-09 LAB — HEMOGLOBINOPATHY EVALUATION
HGB C: 0 %
HGB S: 0 %
HGB VARIANT: 0 %
Hemoglobin A2 Quantitation: 2.8 % (ref 1.8–3.2)
Hemoglobin F Quantitation: 0 % (ref 0.0–2.0)
Hgb A: 97.2 % (ref 96.4–98.8)

## 2018-05-09 LAB — SMN1 COPY NUMBER ANALYSIS (SMA CARRIER SCREENING)

## 2018-05-09 LAB — CYSTIC FIBROSIS MUTATION 97: Interpretation: NOT DETECTED

## 2018-05-14 ENCOUNTER — Other Ambulatory Visit: Payer: Self-pay | Admitting: *Deleted

## 2018-05-14 DIAGNOSIS — Z3687 Encounter for antenatal screening for uncertain dates: Secondary | ICD-10-CM

## 2018-05-14 NOTE — Progress Notes (Signed)
Pt called to office to discuss genetic screening results. It was noted that pt had twin pregnancy on Panor[facility]. It has not been noted in chart that pt has twin pregnancy.  Case was reviewed with Johnsie Cancel and was an error in paperwork.  Change of Authorization form has been sent to [facility] to correct this and review to reflect that pt has singleton pregnancy. Reviewed case with Dr Rip Harbour, he recommends dating u/s to review dates and #gestation.   U/s has been ordered and pt to be scheduled.

## 2018-05-15 ENCOUNTER — Encounter: Payer: Self-pay | Admitting: Obstetrics and Gynecology

## 2018-05-21 ENCOUNTER — Other Ambulatory Visit: Payer: Self-pay | Admitting: Obstetrics and Gynecology

## 2018-05-21 ENCOUNTER — Ambulatory Visit (HOSPITAL_COMMUNITY)
Admission: RE | Admit: 2018-05-21 | Discharge: 2018-05-21 | Disposition: A | Discharge status: Home / Self Care | Payer: Medicaid Other | Source: Ambulatory Visit | Attending: Obstetrics and Gynecology | Admitting: Obstetrics and Gynecology

## 2018-05-21 ENCOUNTER — Encounter (HOSPITAL_COMMUNITY): Payer: Self-pay

## 2018-05-21 DIAGNOSIS — D259 Leiomyoma of uterus, unspecified: Secondary | ICD-10-CM | POA: Diagnosis not present

## 2018-05-21 DIAGNOSIS — B977 Papillomavirus as the cause of diseases classified elsewhere: Secondary | ICD-10-CM | POA: Diagnosis not present

## 2018-05-21 DIAGNOSIS — O30032 Twin pregnancy, monochorionic/diamniotic, second trimester: Secondary | ICD-10-CM | POA: Diagnosis not present

## 2018-05-21 DIAGNOSIS — O3429 Maternal care due to uterine scar from other previous surgery: Secondary | ICD-10-CM | POA: Diagnosis not present

## 2018-05-21 DIAGNOSIS — Z3687 Encounter for antenatal screening for uncertain dates: Secondary | ICD-10-CM | POA: Insufficient documentation

## 2018-05-21 DIAGNOSIS — O3410 Maternal care for benign tumor of corpus uteri, unspecified trimester: Secondary | ICD-10-CM | POA: Insufficient documentation

## 2018-05-21 DIAGNOSIS — O09529 Supervision of elderly multigravida, unspecified trimester: Secondary | ICD-10-CM | POA: Diagnosis not present

## 2018-05-21 DIAGNOSIS — O3412 Maternal care for benign tumor of corpus uteri, second trimester: Secondary | ICD-10-CM | POA: Diagnosis not present

## 2018-05-21 DIAGNOSIS — A5901 Trichomonal vulvovaginitis: Secondary | ICD-10-CM | POA: Insufficient documentation

## 2018-05-21 DIAGNOSIS — Z3A16 16 weeks gestation of pregnancy: Secondary | ICD-10-CM | POA: Diagnosis not present

## 2018-05-21 DIAGNOSIS — O23599 Infection of other part of genital tract in pregnancy, unspecified trimester: Secondary | ICD-10-CM | POA: Diagnosis not present

## 2018-05-22 ENCOUNTER — Other Ambulatory Visit (HOSPITAL_COMMUNITY): Payer: Self-pay | Admitting: *Deleted

## 2018-05-22 DIAGNOSIS — O30032 Twin pregnancy, monochorionic/diamniotic, second trimester: Secondary | ICD-10-CM

## 2018-05-26 ENCOUNTER — Encounter: Payer: Medicaid Other | Admitting: Obstetrics and Gynecology

## 2018-06-02 ENCOUNTER — Ambulatory Visit (HOSPITAL_COMMUNITY): Payer: Medicaid Other | Admitting: *Deleted

## 2018-06-02 ENCOUNTER — Encounter (HOSPITAL_COMMUNITY): Payer: Self-pay

## 2018-06-02 ENCOUNTER — Ambulatory Visit (HOSPITAL_COMMUNITY)
Admission: RE | Admit: 2018-06-02 | Discharge: 2018-06-02 | Disposition: A | Discharge status: Home / Self Care | Payer: Medicaid Other | Source: Ambulatory Visit | Attending: Obstetrics and Gynecology | Admitting: Obstetrics and Gynecology

## 2018-06-02 VITALS — BP 118/71 | HR 78 | Wt 125.4 lb

## 2018-06-02 DIAGNOSIS — O3412 Maternal care for benign tumor of corpus uteri, second trimester: Secondary | ICD-10-CM

## 2018-06-02 DIAGNOSIS — O30032 Twin pregnancy, monochorionic/diamniotic, second trimester: Secondary | ICD-10-CM | POA: Diagnosis not present

## 2018-06-02 DIAGNOSIS — D259 Leiomyoma of uterus, unspecified: Secondary | ICD-10-CM | POA: Diagnosis not present

## 2018-06-02 DIAGNOSIS — O3429 Maternal care due to uterine scar from other previous surgery: Secondary | ICD-10-CM

## 2018-06-02 DIAGNOSIS — Z3A17 17 weeks gestation of pregnancy: Secondary | ICD-10-CM | POA: Diagnosis not present

## 2018-06-04 ENCOUNTER — Ambulatory Visit (INDEPENDENT_AMBULATORY_CARE_PROVIDER_SITE_OTHER): Payer: Medicaid Other | Admitting: Obstetrics & Gynecology

## 2018-06-04 VITALS — BP 132/76 | HR 94 | Wt 126.0 lb

## 2018-06-04 DIAGNOSIS — O09522 Supervision of elderly multigravida, second trimester: Secondary | ICD-10-CM

## 2018-06-04 DIAGNOSIS — Z3490 Encounter for supervision of normal pregnancy, unspecified, unspecified trimester: Secondary | ICD-10-CM

## 2018-06-04 DIAGNOSIS — Z3A18 18 weeks gestation of pregnancy: Secondary | ICD-10-CM

## 2018-06-04 DIAGNOSIS — O30032 Twin pregnancy, monochorionic/diamniotic, second trimester: Secondary | ICD-10-CM

## 2018-06-04 DIAGNOSIS — O30039 Twin pregnancy, monochorionic/diamniotic, unspecified trimester: Secondary | ICD-10-CM

## 2018-06-04 DIAGNOSIS — B977 Papillomavirus as the cause of diseases classified elsewhere: Secondary | ICD-10-CM

## 2018-06-04 DIAGNOSIS — O3429 Maternal care due to uterine scar from other previous surgery: Secondary | ICD-10-CM

## 2018-06-04 DIAGNOSIS — O09529 Supervision of elderly multigravida, unspecified trimester: Secondary | ICD-10-CM

## 2018-06-04 DIAGNOSIS — O30009 Twin pregnancy, unspecified number of placenta and unspecified number of amniotic sacs, unspecified trimester: Secondary | ICD-10-CM | POA: Insufficient documentation

## 2018-06-04 NOTE — Progress Notes (Signed)
   PRENATAL VISIT NOTE  Subjective:  Dawn Mayo is a 33 y.o. G3P0020 at [redacted]w[redacted]d being seen today for ongoing prenatal care.  She is currently monitored for the following issues for this high-risk pregnancy and has Complex ovarian cyst; Supervision of normal pregnancy, antepartum; Antepartum multigravida of advanced maternal age; Uterine scar from previous surgery affecting pregnancy; Uterine fibroids affecting pregnancy; HPV (human papilloma virus) infection; Trichomonal vaginitis during pregnancy, antepartum; and Pregnancy, twin, antepartum on their problem list.  Patient reports no complaints.  Contractions: Not present. Vag. Bleeding: None.  Movement: Present. Denies leaking of fluid.   The following portions of the patient's history were reviewed and updated as appropriate: allergies, current medications, past family history, past medical history, past social history, past surgical history and problem list. Problem list updated.  Objective:   Vitals:   06/04/18 1402  BP: 132/76  Pulse: 94  Weight: 126 lb (57.2 kg)    Fetal Status: Fetal Heart Rate (bpm): 160/154   Movement: Present     General:  Alert, oriented and cooperative. Patient is in no acute distress.  Skin: Skin is warm and dry. No rash noted.   Cardiovascular: Normal heart rate noted  Respiratory: Normal respiratory effort, no problems with respiration noted  Abdomen: Soft, gravid, appropriate for gestational age.  Pain/Pressure: Absent     Pelvic: Cervical exam deferred        Extremities: Normal range of motion.  Edema: None  Mental Status: Normal mood and affect. Normal behavior. Normal judgment and thought content.   Assessment and Plan:  Pregnancy: G3P0020 at [redacted]w[redacted]d  1. Encounter for supervision of normal pregnancy, antepartum, unspecified gravidity Transferred from [facility] to [facility] today  2. Antepartum multigravida of advanced maternal age Genetic testing obtained.   3. HPV (human papilloma virus)  infection  4. Uterine scar from previous surgery affecting pregnancy Prev myomectomy. No myometrial involvement.    5. Monochorionic diamniotic twin pregnancy, antepartum 05/21/2018 Recommendations  -Follow-up scan in 2 weeks for TTTS surveillance.  -Fetal growth assessment in 4 weeks.  -Serial fetal growth assessments.  -Weekly antenatal testing from 32 weeks till delivery.  -Delivery at 37 weeks.  -Vaginal delivery is not contraindicated (removal of  pedunculated myomas).  -Low-dose aspirin (81 mg) daily till delivery.  Preterm labor symptoms and general obstetric precautions including but not limited to vaginal bleeding, contractions, leaking of fluid and fetal movement were reviewed in detail with the patient. Please refer to After Visit Summary for other counseling recommendations.  Return in about 4 weeks (around 07/02/2018).  Future Appointments  Date Time Provider [place]  06/16/2018 10:00 AM [facility] [place] 3 WH-MFCUS MFC-US  07/03/2018  9:30 AM Lavonia Drafts, MD [facility] None    Lavonia Drafts, MD

## 2018-06-06 LAB — AFP TETRA
DIA MOM VALUE: 4.82
DIA VALUE (EIA): 893.79 pg/mL
DSR (BY AGE) 1 IN: 279
DSR (Second Trimester) 1 IN: 534
Gestational Age: 18 WEEKS
MSAFP MOM: 3.23
MSAFP: 172.7 ng/mL
MSHCG Mom: 1.62
MSHCG: 51812 m[IU]/mL
Maternal Age At EDD: 35.3 yr
Osb Risk: 577
TEST RESULTS AFP: NEGATIVE
UE3 MOM: 1.28
UE3 VALUE: 1.82 ng/mL
Weight: 126 [lb_av]

## 2018-06-16 ENCOUNTER — Ambulatory Visit (HOSPITAL_COMMUNITY)
Admission: RE | Admit: 2018-06-16 | Discharge: 2018-06-16 | Disposition: A | Discharge status: Home / Self Care | Payer: Medicaid Other | Source: Ambulatory Visit | Attending: Obstetrics and Gynecology | Admitting: Obstetrics and Gynecology

## 2018-06-16 ENCOUNTER — Ambulatory Visit (HOSPITAL_COMMUNITY): Payer: Medicaid Other | Admitting: *Deleted

## 2018-06-16 ENCOUNTER — Other Ambulatory Visit (HOSPITAL_COMMUNITY): Payer: Self-pay | Admitting: *Deleted

## 2018-06-16 ENCOUNTER — Other Ambulatory Visit: Payer: Self-pay

## 2018-06-16 ENCOUNTER — Encounter (HOSPITAL_COMMUNITY): Payer: Self-pay

## 2018-06-16 VITALS — BP 111/72 | HR 88 | Wt 129.2 lb

## 2018-06-16 DIAGNOSIS — O3429 Maternal care due to uterine scar from other previous surgery: Secondary | ICD-10-CM | POA: Diagnosis not present

## 2018-06-16 DIAGNOSIS — O0990 Supervision of high risk pregnancy, unspecified, unspecified trimester: Secondary | ICD-10-CM

## 2018-06-16 DIAGNOSIS — D259 Leiomyoma of uterus, unspecified: Secondary | ICD-10-CM | POA: Diagnosis not present

## 2018-06-16 DIAGNOSIS — O30032 Twin pregnancy, monochorionic/diamniotic, second trimester: Secondary | ICD-10-CM | POA: Diagnosis not present

## 2018-06-16 DIAGNOSIS — Z362 Encounter for other antenatal screening follow-up: Secondary | ICD-10-CM | POA: Diagnosis not present

## 2018-06-16 DIAGNOSIS — O3412 Maternal care for benign tumor of corpus uteri, second trimester: Secondary | ICD-10-CM

## 2018-06-16 DIAGNOSIS — Z3A19 19 weeks gestation of pregnancy: Secondary | ICD-10-CM | POA: Diagnosis not present

## 2018-06-30 ENCOUNTER — Other Ambulatory Visit: Payer: Self-pay

## 2018-06-30 ENCOUNTER — Ambulatory Visit (HOSPITAL_COMMUNITY): Payer: Medicaid Other | Admitting: *Deleted

## 2018-06-30 ENCOUNTER — Ambulatory Visit (HOSPITAL_COMMUNITY)
Admission: RE | Admit: 2018-06-30 | Discharge: 2018-06-30 | Disposition: A | Discharge status: Home / Self Care | Payer: Medicaid Other | Source: Ambulatory Visit | Attending: Obstetrics and Gynecology | Admitting: Obstetrics and Gynecology

## 2018-06-30 ENCOUNTER — Encounter (HOSPITAL_COMMUNITY): Payer: Self-pay

## 2018-06-30 VITALS — BP 118/76 | HR 67 | Temp 98.3°F

## 2018-06-30 DIAGNOSIS — O3429 Maternal care due to uterine scar from other previous surgery: Secondary | ICD-10-CM | POA: Diagnosis not present

## 2018-06-30 DIAGNOSIS — Z3A21 21 weeks gestation of pregnancy: Secondary | ICD-10-CM

## 2018-06-30 DIAGNOSIS — O0990 Supervision of high risk pregnancy, unspecified, unspecified trimester: Secondary | ICD-10-CM

## 2018-06-30 DIAGNOSIS — D259 Leiomyoma of uterus, unspecified: Secondary | ICD-10-CM

## 2018-06-30 DIAGNOSIS — O3412 Maternal care for benign tumor of corpus uteri, second trimester: Secondary | ICD-10-CM

## 2018-06-30 DIAGNOSIS — O30032 Twin pregnancy, monochorionic/diamniotic, second trimester: Secondary | ICD-10-CM

## 2018-07-03 ENCOUNTER — Ambulatory Visit (INDEPENDENT_AMBULATORY_CARE_PROVIDER_SITE_OTHER): Payer: Medicaid Other | Admitting: Obstetrics & Gynecology

## 2018-07-03 ENCOUNTER — Encounter: Payer: Self-pay | Admitting: Obstetrics & Gynecology

## 2018-07-03 ENCOUNTER — Encounter: Payer: Medicaid Other | Admitting: Obstetrics & Gynecology

## 2018-07-03 VITALS — Wt 130.0 lb

## 2018-07-03 DIAGNOSIS — O30002 Twin pregnancy, unspecified number of placenta and unspecified number of amniotic sacs, second trimester: Secondary | ICD-10-CM | POA: Diagnosis not present

## 2018-07-03 DIAGNOSIS — Z3A22 22 weeks gestation of pregnancy: Secondary | ICD-10-CM

## 2018-07-03 DIAGNOSIS — O3429 Maternal care due to uterine scar from other previous surgery: Secondary | ICD-10-CM | POA: Diagnosis not present

## 2018-07-03 DIAGNOSIS — O09522 Supervision of elderly multigravida, second trimester: Secondary | ICD-10-CM | POA: Diagnosis not present

## 2018-07-03 DIAGNOSIS — B977 Papillomavirus as the cause of diseases classified elsewhere: Secondary | ICD-10-CM

## 2018-07-03 DIAGNOSIS — O30009 Twin pregnancy, unspecified number of placenta and unspecified number of amniotic sacs, unspecified trimester: Secondary | ICD-10-CM

## 2018-07-03 DIAGNOSIS — O09529 Supervision of elderly multigravida, unspecified trimester: Secondary | ICD-10-CM

## 2018-07-03 DIAGNOSIS — Z3480 Encounter for supervision of other normal pregnancy, unspecified trimester: Secondary | ICD-10-CM

## 2018-07-03 NOTE — Progress Notes (Signed)
   TELEHEALTH VIRTUAL OBSTETRICS VISIT ENCOUNTER NOTE  I connected with Dawn Mayo on 07/03/18 at  9:30 AM EDT by telephone at home and verified that I am speaking with the correct person using two identifiers.   I discussed the limitations, risks, security and privacy concerns of performing an evaluation and management service by telephone and the availability of in person appointments. I also discussed with the patient that there may be a patient responsible charge related to this service. The patient expressed understanding and agreed to proceed.  Subjective:  Dawn Mayo is a 33 y.o. G3P0020 at [redacted]w[redacted]d being followed for ongoing prenatal care.  She is currently monitored for the following issues for this high-risk pregnancy and has Complex ovarian cyst; Supervision of normal pregnancy, antepartum; Antepartum multigravida of advanced maternal age; Uterine scar from previous surgery affecting pregnancy; Uterine fibroids affecting pregnancy; HPV (human papilloma virus) infection; Trichomonal vaginitis during pregnancy, antepartum; and Pregnancy, twin, antepartum on their problem list.  Patient reports Pain over symphysis pubis. . Reports fetal movement. Denies any contractions, bleeding or leaking of fluid.   The following portions of the patient's history were reviewed and updated as appropriate: allergies, current medications, past family history, past medical history, past social history, past surgical history and problem list.   Objective:   General:  Alert, oriented and cooperative.   Mental Status: Normal mood and affect perceived. Normal judgment and thought content.  Rest of physical exam deferred due to type of encounter  Assessment and Plan:  Pregnancy: G3P0020 at [redacted]w[redacted]d  Patient Active Problem List   Diagnosis Date Noted  . Pregnancy, twin, antepartum 06/04/2018  . 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 Trichomonal vaginitis during pregnancy, antepartum 05/04/2018   . Antepartum multigravida of advanced maternal age 77/28/2020  . Uterine scar from previous surgery affecting pregnancy 04/29/2018  . Uterine fibroids affecting pregnancy 04/29/2018  . Supervision of normal pregnancy, antepartum 04/28/2018  . Complex ovarian cyst 07/24/2012   Has [place] for growth in 2 weeks. Pt will get a BP cuff and check her BP every 2 weeks.  Preterm labor symptoms and general obstetric precautions including but not limited to vaginal bleeding, contractions, leaking of fluid and fetal movement were reviewed in detail with the patient.  I discussed the assessment and treatment plan with the patient. The patient was provided an opportunity to ask questions and all were answered. The patient agreed with the plan and demonstrated an understanding of the instructions. The patient was advised to call back or seek an in-person office evaluation/go to [facility]U at [facility] for any urgent or concerning symptoms. Please refer to After Visit Summary for other counseling recommendations.   I provided 17 minutes of non-face-to-face time during this encounter.  No follow-ups on file.  Future Appointments  Date Time Provider [place]  07/14/2018  8:30 AM [place] MFC-US  07/14/2018  8:30 AM [place] 1 WH-MFCUS MFC-US    Lavonia Drafts, MD Center for [facility], [place]

## 2018-07-14 ENCOUNTER — Ambulatory Visit (HOSPITAL_COMMUNITY): Payer: Medicaid Other

## 2018-07-14 ENCOUNTER — Ambulatory Visit (HOSPITAL_COMMUNITY)
Admission: RE | Admit: 2018-07-14 | Discharge: 2018-07-14 | Disposition: A | Discharge status: Home / Self Care | Payer: Medicaid Other | Source: Ambulatory Visit | Attending: Obstetrics and Gynecology | Admitting: Obstetrics and Gynecology

## 2018-07-14 ENCOUNTER — Other Ambulatory Visit: Payer: Self-pay

## 2018-07-14 DIAGNOSIS — O30032 Twin pregnancy, monochorionic/diamniotic, second trimester: Secondary | ICD-10-CM

## 2018-07-21 ENCOUNTER — Ambulatory Visit (HOSPITAL_COMMUNITY): Payer: Medicaid Other | Admitting: *Deleted

## 2018-07-21 ENCOUNTER — Ambulatory Visit (HOSPITAL_COMMUNITY)
Admission: RE | Admit: 2018-07-21 | Discharge: 2018-07-21 | Disposition: A | Discharge status: Home / Self Care | Payer: Medicaid Other | Source: Ambulatory Visit | Attending: Obstetrics and Gynecology | Admitting: Obstetrics and Gynecology

## 2018-07-21 ENCOUNTER — Encounter (HOSPITAL_COMMUNITY): Payer: Self-pay | Admitting: *Deleted

## 2018-07-21 ENCOUNTER — Other Ambulatory Visit (HOSPITAL_COMMUNITY): Payer: Self-pay | Admitting: Obstetrics and Gynecology

## 2018-07-21 ENCOUNTER — Other Ambulatory Visit (HOSPITAL_COMMUNITY): Payer: Self-pay | Admitting: *Deleted

## 2018-07-21 ENCOUNTER — Other Ambulatory Visit: Payer: Self-pay

## 2018-07-21 DIAGNOSIS — Z362 Encounter for other antenatal screening follow-up: Secondary | ICD-10-CM | POA: Diagnosis not present

## 2018-07-21 DIAGNOSIS — O30009 Twin pregnancy, unspecified number of placenta and unspecified number of amniotic sacs, unspecified trimester: Secondary | ICD-10-CM | POA: Diagnosis present

## 2018-07-21 DIAGNOSIS — O365921 Maternal care for other known or suspected poor fetal growth, second trimester, fetus 1: Secondary | ICD-10-CM

## 2018-07-21 DIAGNOSIS — Z3A24 24 weeks gestation of pregnancy: Secondary | ICD-10-CM

## 2018-07-21 DIAGNOSIS — B977 Papillomavirus as the cause of diseases classified elsewhere: Secondary | ICD-10-CM | POA: Diagnosis present

## 2018-07-21 DIAGNOSIS — O3429 Maternal care due to uterine scar from other previous surgery: Secondary | ICD-10-CM

## 2018-07-21 DIAGNOSIS — O365922 Maternal care for other known or suspected poor fetal growth, second trimester, fetus 2: Secondary | ICD-10-CM | POA: Diagnosis not present

## 2018-07-21 DIAGNOSIS — O09529 Supervision of elderly multigravida, unspecified trimester: Secondary | ICD-10-CM | POA: Insufficient documentation

## 2018-07-21 DIAGNOSIS — O30039 Twin pregnancy, monochorionic/diamniotic, unspecified trimester: Secondary | ICD-10-CM

## 2018-07-21 DIAGNOSIS — O30032 Twin pregnancy, monochorionic/diamniotic, second trimester: Secondary | ICD-10-CM

## 2018-07-21 DIAGNOSIS — O3410 Maternal care for benign tumor of corpus uteri, unspecified trimester: Secondary | ICD-10-CM

## 2018-07-21 DIAGNOSIS — O365990 Maternal care for other known or suspected poor fetal growth, unspecified trimester, not applicable or unspecified: Secondary | ICD-10-CM

## 2018-07-21 DIAGNOSIS — O23599 Infection of other part of genital tract in pregnancy, unspecified trimester: Secondary | ICD-10-CM

## 2018-07-21 DIAGNOSIS — O3412 Maternal care for benign tumor of corpus uteri, second trimester: Secondary | ICD-10-CM | POA: Diagnosis not present

## 2018-07-21 DIAGNOSIS — D259 Leiomyoma of uterus, unspecified: Secondary | ICD-10-CM

## 2018-07-21 DIAGNOSIS — A5901 Trichomonal vulvovaginitis: Secondary | ICD-10-CM | POA: Insufficient documentation

## 2018-07-31 ENCOUNTER — Other Ambulatory Visit: Payer: Self-pay

## 2018-07-31 ENCOUNTER — Ambulatory Visit (HOSPITAL_COMMUNITY): Payer: Medicaid Other | Admitting: *Deleted

## 2018-07-31 ENCOUNTER — Ambulatory Visit (HOSPITAL_COMMUNITY)
Admission: RE | Admit: 2018-07-31 | Discharge: 2018-07-31 | Disposition: A | Discharge status: Home / Self Care | Payer: Medicaid Other | Source: Ambulatory Visit | Attending: Obstetrics and Gynecology | Admitting: Obstetrics and Gynecology

## 2018-07-31 ENCOUNTER — Encounter (HOSPITAL_COMMUNITY): Payer: Self-pay

## 2018-07-31 VITALS — BP 139/87 | HR 80 | Temp 99.1°F

## 2018-07-31 DIAGNOSIS — O0990 Supervision of high risk pregnancy, unspecified, unspecified trimester: Secondary | ICD-10-CM | POA: Insufficient documentation

## 2018-07-31 DIAGNOSIS — Z3A26 26 weeks gestation of pregnancy: Secondary | ICD-10-CM

## 2018-07-31 DIAGNOSIS — O30032 Twin pregnancy, monochorionic/diamniotic, second trimester: Secondary | ICD-10-CM

## 2018-07-31 DIAGNOSIS — O365921 Maternal care for other known or suspected poor fetal growth, second trimester, fetus 1: Secondary | ICD-10-CM | POA: Diagnosis not present

## 2018-07-31 DIAGNOSIS — O365990 Maternal care for other known or suspected poor fetal growth, unspecified trimester, not applicable or unspecified: Secondary | ICD-10-CM | POA: Diagnosis present

## 2018-07-31 DIAGNOSIS — O3429 Maternal care due to uterine scar from other previous surgery: Secondary | ICD-10-CM | POA: Diagnosis not present

## 2018-07-31 DIAGNOSIS — O3412 Maternal care for benign tumor of corpus uteri, second trimester: Secondary | ICD-10-CM | POA: Diagnosis not present

## 2018-07-31 DIAGNOSIS — O30039 Twin pregnancy, monochorionic/diamniotic, unspecified trimester: Secondary | ICD-10-CM | POA: Insufficient documentation

## 2018-07-31 DIAGNOSIS — D259 Leiomyoma of uterus, unspecified: Secondary | ICD-10-CM

## 2018-07-31 DIAGNOSIS — O365922 Maternal care for other known or suspected poor fetal growth, second trimester, fetus 2: Secondary | ICD-10-CM | POA: Diagnosis not present

## 2018-08-04 ENCOUNTER — Other Ambulatory Visit: Payer: Self-pay

## 2018-08-04 ENCOUNTER — Ambulatory Visit (INDEPENDENT_AMBULATORY_CARE_PROVIDER_SITE_OTHER): Payer: Medicaid Other | Admitting: Family Medicine

## 2018-08-04 VITALS — BP 127/88 | HR 77 | Wt 144.0 lb

## 2018-08-04 DIAGNOSIS — O09522 Supervision of elderly multigravida, second trimester: Secondary | ICD-10-CM

## 2018-08-04 DIAGNOSIS — O30039 Twin pregnancy, monochorionic/diamniotic, unspecified trimester: Secondary | ICD-10-CM

## 2018-08-04 DIAGNOSIS — O09529 Supervision of elderly multigravida, unspecified trimester: Secondary | ICD-10-CM

## 2018-08-04 DIAGNOSIS — Z3480 Encounter for supervision of other normal pregnancy, unspecified trimester: Secondary | ICD-10-CM

## 2018-08-04 DIAGNOSIS — O30032 Twin pregnancy, monochorionic/diamniotic, second trimester: Secondary | ICD-10-CM

## 2018-08-04 DIAGNOSIS — Z3A26 26 weeks gestation of pregnancy: Secondary | ICD-10-CM

## 2018-08-04 DIAGNOSIS — O3429 Maternal care due to uterine scar from other previous surgery: Secondary | ICD-10-CM

## 2018-08-04 NOTE — Progress Notes (Signed)
   PRENATAL VISIT NOTE  Subjective:  Dawn Mayo is a 33 y.o. G3P0020 at [redacted]w[redacted]d being seen today for ongoing prenatal care.  She is currently monitored for the following issues for this high-risk pregnancy and has Complex ovarian cyst; Supervision of normal pregnancy, antepartum; Antepartum multigravida of advanced maternal age; Uterine scar from previous surgery affecting pregnancy; Uterine fibroids affecting pregnancy; HPV (human papilloma virus) infection; Trichomonal vaginitis during pregnancy, antepartum; and Pregnancy, twin, antepartum on their problem list.  Patient reports no complaints.  Contractions: Not present. Vag. Bleeding: None.  Movement: Present. Denies leaking of fluid.   The following portions of the patient's history were reviewed and updated as appropriate: allergies, current medications, past family history, past medical history, past social history, past surgical history and problem list.   Objective:   Vitals:   08/04/18 0817  BP: 127/88  Pulse: 77  Weight: 144 lb (65.3 kg)    Fetal Status: Fetal Heart Rate (bpm): 150/161   Movement: Present     General:  Alert, oriented and cooperative. Patient is in no acute distress.  Skin: Skin is warm and dry. No rash noted.   Cardiovascular: Normal heart rate noted  Respiratory: Normal respiratory effort, no problems with respiration noted  Abdomen: Soft, gravid, appropriate for gestational age.  Pain/Pressure: Present     Pelvic: Cervical exam deferred        Extremities: Normal range of motion.  Edema: None  Mental Status: Normal mood and affect. Normal behavior. Normal judgment and thought content.   Assessment and Plan:  Pregnancy: [id number] at [redacted]w[redacted]d 1. Supervision of other normal pregnancy, antepartum FHT normal - CBC - Glucose Tolerance, 2 Hours w/1 Hour - HIV Antibody (routine testing w rflx) - RPR  2. Monochorionic diamniotic twin pregnancy, antepartum Abnormal dopplers. Having weekly dopplers.  Growth in 2  weeks.  3. Uterine scar from previous surgery affecting pregnancy No myometrial involvement No contraindication to vaginal delivery  4. Antepartum multigravida of advanced maternal age   Preterm labor symptoms and general obstetric precautions including but not limited to vaginal bleeding, contractions, leaking of fluid and fetal movement were reviewed in detail with the patient. Please refer to After Visit Summary for other counseling recommendations.   Return in about 4 weeks (around 09/01/2018) for OB f/u, virtual.  Future Appointments  Date Time Provider [place]  08/07/2018  1:30 PM [place] MFC-US  08/07/2018  1:30 PM [facility]-MFC [place] 1 WH-MFCUS MFC-US  08/14/2018  9:00 AM [place] MFC-US  08/14/2018  9:00 AM [facility]-MFC [place] 3 WH-MFCUS MFC-US     J , DO

## 2018-08-05 LAB — GLUCOSE TOLERANCE, 2 HOURS W/ 1HR
Glucose, 1 hour: 152 mg/dL (ref 65–179)
Glucose, 2 hour: 130 mg/dL (ref 65–152)
Glucose, Fasting: 67 mg/dL (ref 65–91)

## 2018-08-05 LAB — CBC
Hematocrit: 38 % (ref 34.0–46.6)
Hemoglobin: 12.9 g/dL (ref 11.1–15.9)
MCH: 30.1 pg (ref 26.6–33.0)
MCHC: 33.9 g/dL (ref 31.5–35.7)
MCV: 89 fL (ref 79–97)
Platelets: 306 10*3/uL (ref 150–450)
RBC: 4.29 x10E6/uL (ref 3.77–5.28)
RDW: 13.3 % (ref 11.7–15.4)
WBC: 11.1 10*3/uL — ABNORMAL HIGH (ref 3.4–10.8)

## 2018-08-05 LAB — RPR: RPR Ser Ql: NONREACTIVE

## 2018-08-05 LAB — HIV ANTIBODY (ROUTINE TESTING W REFLEX): HIV Screen 4th Generation wRfx: NONREACTIVE

## 2018-08-07 ENCOUNTER — Encounter (HOSPITAL_COMMUNITY): Payer: Self-pay | Admitting: *Deleted

## 2018-08-07 ENCOUNTER — Other Ambulatory Visit: Payer: Self-pay

## 2018-08-07 ENCOUNTER — Ambulatory Visit (HOSPITAL_COMMUNITY)
Admission: RE | Admit: 2018-08-07 | Discharge: 2018-08-07 | Disposition: A | Discharge status: Home / Self Care | Payer: Medicaid Other | Source: Ambulatory Visit | Attending: Obstetrics and Gynecology | Admitting: Obstetrics and Gynecology

## 2018-08-07 ENCOUNTER — Ambulatory Visit (HOSPITAL_COMMUNITY): Payer: Medicaid Other | Admitting: *Deleted

## 2018-08-07 DIAGNOSIS — O30032 Twin pregnancy, monochorionic/diamniotic, second trimester: Secondary | ICD-10-CM | POA: Diagnosis not present

## 2018-08-07 DIAGNOSIS — Z3A27 27 weeks gestation of pregnancy: Secondary | ICD-10-CM | POA: Diagnosis not present

## 2018-08-07 DIAGNOSIS — O3410 Maternal care for benign tumor of corpus uteri, unspecified trimester: Secondary | ICD-10-CM

## 2018-08-07 DIAGNOSIS — O30009 Twin pregnancy, unspecified number of placenta and unspecified number of amniotic sacs, unspecified trimester: Secondary | ICD-10-CM

## 2018-08-07 DIAGNOSIS — O368921 Maternal care for other specified fetal problems, second trimester, fetus 1: Secondary | ICD-10-CM | POA: Diagnosis not present

## 2018-08-07 DIAGNOSIS — A5901 Trichomonal vulvovaginitis: Secondary | ICD-10-CM | POA: Insufficient documentation

## 2018-08-07 DIAGNOSIS — O09529 Supervision of elderly multigravida, unspecified trimester: Secondary | ICD-10-CM | POA: Diagnosis present

## 2018-08-07 DIAGNOSIS — O23599 Infection of other part of genital tract in pregnancy, unspecified trimester: Secondary | ICD-10-CM | POA: Diagnosis present

## 2018-08-07 DIAGNOSIS — D259 Leiomyoma of uterus, unspecified: Secondary | ICD-10-CM | POA: Insufficient documentation

## 2018-08-07 DIAGNOSIS — B977 Papillomavirus as the cause of diseases classified elsewhere: Secondary | ICD-10-CM

## 2018-08-07 DIAGNOSIS — O3412 Maternal care for benign tumor of corpus uteri, second trimester: Secondary | ICD-10-CM | POA: Diagnosis not present

## 2018-08-07 DIAGNOSIS — O365990 Maternal care for other known or suspected poor fetal growth, unspecified trimester, not applicable or unspecified: Secondary | ICD-10-CM | POA: Diagnosis present

## 2018-08-07 DIAGNOSIS — O3429 Maternal care due to uterine scar from other previous surgery: Secondary | ICD-10-CM | POA: Diagnosis not present

## 2018-08-07 DIAGNOSIS — O365922 Maternal care for other known or suspected poor fetal growth, second trimester, fetus 2: Secondary | ICD-10-CM | POA: Diagnosis not present

## 2018-08-07 DIAGNOSIS — O30039 Twin pregnancy, monochorionic/diamniotic, unspecified trimester: Secondary | ICD-10-CM | POA: Insufficient documentation

## 2018-08-14 ENCOUNTER — Ambulatory Visit (HOSPITAL_COMMUNITY): Payer: Medicaid Other | Admitting: *Deleted

## 2018-08-14 ENCOUNTER — Ambulatory Visit (HOSPITAL_COMMUNITY)
Admission: RE | Admit: 2018-08-14 | Discharge: 2018-08-14 | Disposition: A | Discharge status: Home / Self Care | Payer: Medicaid Other | Source: Ambulatory Visit | Attending: Obstetrics and Gynecology | Admitting: Obstetrics and Gynecology

## 2018-08-14 ENCOUNTER — Ambulatory Visit (HOSPITAL_COMMUNITY): Payer: Medicaid Other

## 2018-08-14 ENCOUNTER — Encounter (HOSPITAL_COMMUNITY): Payer: Self-pay

## 2018-08-14 ENCOUNTER — Other Ambulatory Visit: Payer: Self-pay

## 2018-08-14 VITALS — BP 134/83 | HR 81 | Temp 98.4°F

## 2018-08-14 DIAGNOSIS — O365931 Maternal care for other known or suspected poor fetal growth, third trimester, fetus 1: Secondary | ICD-10-CM | POA: Diagnosis not present

## 2018-08-14 DIAGNOSIS — O30033 Twin pregnancy, monochorionic/diamniotic, third trimester: Secondary | ICD-10-CM | POA: Diagnosis not present

## 2018-08-14 DIAGNOSIS — O30039 Twin pregnancy, monochorionic/diamniotic, unspecified trimester: Secondary | ICD-10-CM

## 2018-08-14 DIAGNOSIS — Z362 Encounter for other antenatal screening follow-up: Secondary | ICD-10-CM

## 2018-08-14 DIAGNOSIS — O3413 Maternal care for benign tumor of corpus uteri, third trimester: Secondary | ICD-10-CM

## 2018-08-14 DIAGNOSIS — Z3A28 28 weeks gestation of pregnancy: Secondary | ICD-10-CM | POA: Diagnosis not present

## 2018-08-14 DIAGNOSIS — O365932 Maternal care for other known or suspected poor fetal growth, third trimester, fetus 2: Secondary | ICD-10-CM

## 2018-08-14 DIAGNOSIS — O3429 Maternal care due to uterine scar from other previous surgery: Secondary | ICD-10-CM

## 2018-08-14 DIAGNOSIS — D259 Leiomyoma of uterus, unspecified: Secondary | ICD-10-CM

## 2018-08-14 DIAGNOSIS — O365990 Maternal care for other known or suspected poor fetal growth, unspecified trimester, not applicable or unspecified: Secondary | ICD-10-CM | POA: Insufficient documentation

## 2018-08-14 MED ORDER — BETAMETHASONE SOD PHOS & ACET 6 (3-3) MG/ML IJ SUSP
12.0000 mg | Freq: Once | INTRAMUSCULAR | Status: AC
  Administered 2018-08-15: 12 mg via INTRAMUSCULAR

## 2018-08-14 MED ORDER — BETAMETHASONE SOD PHOS & ACET 6 (3-3) MG/ML IJ SUSP
12.0000 mg | Freq: Once | INTRAMUSCULAR | Status: AC
  Administered 2018-08-14: 11:00:00 12 mg via INTRAMUSCULAR

## 2018-08-15 ENCOUNTER — Ambulatory Visit (HOSPITAL_COMMUNITY): Payer: Medicaid Other | Attending: Obstetrics and Gynecology

## 2018-08-15 ENCOUNTER — Encounter (HOSPITAL_COMMUNITY): Payer: Self-pay

## 2018-08-15 ENCOUNTER — Ambulatory Visit (HOSPITAL_COMMUNITY): Payer: Medicaid Other

## 2018-08-15 ENCOUNTER — Other Ambulatory Visit (HOSPITAL_COMMUNITY): Payer: Self-pay | Admitting: *Deleted

## 2018-08-15 DIAGNOSIS — O30033 Twin pregnancy, monochorionic/diamniotic, third trimester: Secondary | ICD-10-CM | POA: Diagnosis not present

## 2018-08-15 DIAGNOSIS — O365930 Maternal care for other known or suspected poor fetal growth, third trimester, not applicable or unspecified: Secondary | ICD-10-CM | POA: Diagnosis not present

## 2018-08-15 DIAGNOSIS — Z3A00 Weeks of gestation of pregnancy not specified: Secondary | ICD-10-CM | POA: Diagnosis not present

## 2018-08-15 DIAGNOSIS — O30039 Twin pregnancy, monochorionic/diamniotic, unspecified trimester: Secondary | ICD-10-CM

## 2018-08-17 ENCOUNTER — Other Ambulatory Visit: Payer: Self-pay

## 2018-08-17 ENCOUNTER — Inpatient Hospital Stay (HOSPITAL_COMMUNITY): Payer: Medicaid Other | Admitting: Certified Registered Nurse Anesthetist

## 2018-08-17 ENCOUNTER — Encounter (HOSPITAL_COMMUNITY)
Admission: AD | Disposition: A | Discharge status: Home / Self Care | Payer: Self-pay | Source: Home / Self Care | Attending: Obstetrics and Gynecology

## 2018-08-17 ENCOUNTER — Inpatient Hospital Stay (HOSPITAL_COMMUNITY): Payer: Medicaid Other

## 2018-08-17 ENCOUNTER — Encounter (HOSPITAL_COMMUNITY): Payer: Self-pay

## 2018-08-17 ENCOUNTER — Inpatient Hospital Stay (HOSPITAL_COMMUNITY)
Admission: AD | Admit: 2018-08-17 | Discharge: 2018-08-20 | DRG: 788 | Disposition: A | Discharge status: Home / Self Care | Payer: Medicaid Other | Attending: Obstetrics and Gynecology | Admitting: Obstetrics and Gynecology

## 2018-08-17 DIAGNOSIS — Z87891 Personal history of nicotine dependence: Secondary | ICD-10-CM | POA: Diagnosis not present

## 2018-08-17 DIAGNOSIS — D259 Leiomyoma of uterus, unspecified: Secondary | ICD-10-CM | POA: Diagnosis present

## 2018-08-17 DIAGNOSIS — R1011 Right upper quadrant pain: Secondary | ICD-10-CM | POA: Diagnosis not present

## 2018-08-17 DIAGNOSIS — O1424 HELLP syndrome, complicating childbirth: Principal | ICD-10-CM | POA: Diagnosis present

## 2018-08-17 DIAGNOSIS — O1420 HELLP syndrome (HELLP), unspecified trimester: Secondary | ICD-10-CM | POA: Diagnosis present

## 2018-08-17 DIAGNOSIS — O30009 Twin pregnancy, unspecified number of placenta and unspecified number of amniotic sacs, unspecified trimester: Secondary | ICD-10-CM | POA: Diagnosis present

## 2018-08-17 DIAGNOSIS — O365930 Maternal care for other known or suspected poor fetal growth, third trimester, not applicable or unspecified: Secondary | ICD-10-CM | POA: Diagnosis not present

## 2018-08-17 DIAGNOSIS — O1410 Severe pre-eclampsia, unspecified trimester: Secondary | ICD-10-CM | POA: Diagnosis present

## 2018-08-17 DIAGNOSIS — O3410 Maternal care for benign tumor of corpus uteri, unspecified trimester: Secondary | ICD-10-CM | POA: Diagnosis present

## 2018-08-17 DIAGNOSIS — O321XX2 Maternal care for breech presentation, fetus 2: Secondary | ICD-10-CM | POA: Diagnosis not present

## 2018-08-17 DIAGNOSIS — O365931 Maternal care for other known or suspected poor fetal growth, third trimester, fetus 1: Secondary | ICD-10-CM | POA: Diagnosis present

## 2018-08-17 DIAGNOSIS — O30033 Twin pregnancy, monochorionic/diamniotic, third trimester: Secondary | ICD-10-CM | POA: Diagnosis not present

## 2018-08-17 DIAGNOSIS — O3413 Maternal care for benign tumor of corpus uteri, third trimester: Secondary | ICD-10-CM | POA: Diagnosis present

## 2018-08-17 DIAGNOSIS — O30039 Twin pregnancy, monochorionic/diamniotic, unspecified trimester: Secondary | ICD-10-CM

## 2018-08-17 DIAGNOSIS — R52 Pain, unspecified: Secondary | ICD-10-CM

## 2018-08-17 DIAGNOSIS — O1423 HELLP syndrome (HELLP), third trimester: Secondary | ICD-10-CM | POA: Diagnosis not present

## 2018-08-17 DIAGNOSIS — O365932 Maternal care for other known or suspected poor fetal growth, third trimester, fetus 2: Secondary | ICD-10-CM | POA: Diagnosis not present

## 2018-08-17 DIAGNOSIS — R109 Unspecified abdominal pain: Secondary | ICD-10-CM | POA: Diagnosis present

## 2018-08-17 DIAGNOSIS — Z1159 Encounter for screening for other viral diseases: Secondary | ICD-10-CM

## 2018-08-17 DIAGNOSIS — Z3A28 28 weeks gestation of pregnancy: Secondary | ICD-10-CM

## 2018-08-17 DIAGNOSIS — O09529 Supervision of elderly multigravida, unspecified trimester: Secondary | ICD-10-CM

## 2018-08-17 DIAGNOSIS — O3429 Maternal care due to uterine scar from other previous surgery: Secondary | ICD-10-CM | POA: Diagnosis present

## 2018-08-17 DIAGNOSIS — R112 Nausea with vomiting, unspecified: Secondary | ICD-10-CM | POA: Diagnosis not present

## 2018-08-17 HISTORY — DX: Trichomoniasis, unspecified: A59.9

## 2018-08-17 HISTORY — DX: Other specified postprocedural states: Z98.890

## 2018-08-17 HISTORY — DX: Papillomavirus as the cause of diseases classified elsewhere: B97.7

## 2018-08-17 LAB — COMPREHENSIVE METABOLIC PANEL
ALT: 564 U/L — ABNORMAL HIGH (ref 0–44)
ALT: 470 U/L — ABNORMAL HIGH (ref 0–44)
ALT: 373 U/L — ABNORMAL HIGH (ref 0–44)
ALT: 335 U/L — ABNORMAL HIGH (ref 0–44)
AST: 635 U/L — ABNORMAL HIGH (ref 15–41)
AST: 737 U/L — ABNORMAL HIGH (ref 15–41)
AST: 491 U/L — ABNORMAL HIGH (ref 15–41)
AST: 354 U/L — ABNORMAL HIGH (ref 15–41)
Albumin: 3 g/dL — ABNORMAL LOW (ref 3.5–5.0)
Albumin: 2.5 g/dL — ABNORMAL LOW (ref 3.5–5.0)
Albumin: 2.4 g/dL — ABNORMAL LOW (ref 3.5–5.0)
Albumin: 2.6 g/dL — ABNORMAL LOW (ref 3.5–5.0)
Alkaline Phosphatase: 175 U/L — ABNORMAL HIGH (ref 38–126)
Alkaline Phosphatase: 131 U/L — ABNORMAL HIGH (ref 38–126)
Alkaline Phosphatase: 120 U/L (ref 38–126)
Alkaline Phosphatase: 121 U/L (ref 38–126)
Anion gap: 13 (ref 5–15)
Anion gap: 11 (ref 5–15)
Anion gap: 7 (ref 5–15)
Anion gap: 11 (ref 5–15)
BUN: 11 mg/dL (ref 6–20)
BUN: 9 mg/dL (ref 6–20)
BUN: 7 mg/dL (ref 6–20)
BUN: 7 mg/dL (ref 6–20)
CO2: 20 mmol/L — ABNORMAL LOW (ref 22–32)
CO2: 23 mmol/L (ref 22–32)
CO2: 25 mmol/L (ref 22–32)
CO2: 22 mmol/L (ref 22–32)
Calcium: 9.1 mg/dL (ref 8.9–10.3)
Calcium: 7.8 mg/dL — ABNORMAL LOW (ref 8.9–10.3)
Calcium: 7 mg/dL — ABNORMAL LOW (ref 8.9–10.3)
Calcium: 6.7 mg/dL — ABNORMAL LOW (ref 8.9–10.3)
Chloride: 106 mmol/L (ref 98–111)
Chloride: 106 mmol/L (ref 98–111)
Chloride: 105 mmol/L (ref 98–111)
Chloride: 101 mmol/L (ref 98–111)
Creatinine, Ser: 0.85 mg/dL (ref 0.44–1.00)
Creatinine, Ser: 0.83 mg/dL (ref 0.44–1.00)
Creatinine, Ser: 0.78 mg/dL (ref 0.44–1.00)
Creatinine, Ser: 0.83 mg/dL (ref 0.44–1.00)
GFR calc Af Amer: >60 mL/min (ref >60)
GFR calc Af Amer: >60 mL/min (ref >60)
GFR calc Af Amer: >60 mL/min (ref >60)
GFR calc Af Amer: >60 mL/min (ref >60)
GFR calc non Af Amer: >60 mL/min (ref >60)
GFR calc non Af Amer: >60 mL/min (ref >60)
GFR calc non Af Amer: >60 mL/min (ref >60)
GFR calc non Af Amer: >60 mL/min (ref >60)
Glucose, Bld: 99 mg/dL (ref 70–99)
Glucose, Bld: 123 mg/dL — ABNORMAL HIGH (ref 70–99)
Glucose, Bld: 113 mg/dL — ABNORMAL HIGH (ref 70–99)
Glucose, Bld: 100 mg/dL — ABNORMAL HIGH (ref 70–99)
Potassium: 4 mmol/L (ref 3.5–5.1)
Potassium: 3.8 mmol/L (ref 3.5–5.1)
Potassium: 4.3 mmol/L (ref 3.5–5.1)
Potassium: 4 mmol/L (ref 3.5–5.1)
Sodium: 139 mmol/L (ref 135–145)
Sodium: 140 mmol/L (ref 135–145)
Sodium: 137 mmol/L (ref 135–145)
Sodium: 134 mmol/L — ABNORMAL LOW (ref 135–145)
Total Bilirubin: 1 mg/dL (ref 0.3–1.2)
Total Bilirubin: 0.6 mg/dL (ref 0.3–1.2)
Total Bilirubin: 0.7 mg/dL (ref 0.3–1.2)
Total Bilirubin: 0.5 mg/dL (ref 0.3–1.2)
Total Protein: 7.1 g/dL (ref 6.5–8.1)
Total Protein: 5.8 g/dL — ABNORMAL LOW (ref 6.5–8.1)
Total Protein: 5.7 g/dL — ABNORMAL LOW (ref 6.5–8.1)
Total Protein: 6.1 g/dL — ABNORMAL LOW (ref 6.5–8.1)

## 2018-08-17 LAB — PROTIME-INR
INR: 1.1 (ref 0.8–1.2)
INR: 1.2 (ref 0.8–1.2)
Prothrombin Time: 13.8 seconds (ref 11.4–15.2)
Prothrombin Time: 14.8 seconds (ref 11.4–15.2)

## 2018-08-17 LAB — CBC
HCT: 36.3 % (ref 36.0–46.0)
HCT: 36.9 % (ref 36.0–46.0)
HCT: 30.9 % — ABNORMAL LOW (ref 36.0–46.0)
HCT: 28.4 % — ABNORMAL LOW (ref 36.0–46.0)
HCT: 28.8 % — ABNORMAL LOW (ref 36.0–46.0)
Hemoglobin: 12 g/dL (ref 12.0–15.0)
Hemoglobin: 12.2 g/dL (ref 12.0–15.0)
Hemoglobin: 10.2 g/dL — ABNORMAL LOW (ref 12.0–15.0)
Hemoglobin: 9.7 g/dL — ABNORMAL LOW (ref 12.0–15.0)
Hemoglobin: 9.8 g/dL — ABNORMAL LOW (ref 12.0–15.0)
MCH: 29.6 pg (ref 26.0–34.0)
MCH: 29.7 pg (ref 26.0–34.0)
MCH: 29.2 pg (ref 26.0–34.0)
MCH: 29.9 pg (ref 26.0–34.0)
MCH: 30.1 pg (ref 26.0–34.0)
MCHC: 33.1 g/dL (ref 30.0–36.0)
MCHC: 33.1 g/dL (ref 30.0–36.0)
MCHC: 33 g/dL (ref 30.0–36.0)
MCHC: 34.2 g/dL (ref 30.0–36.0)
MCHC: 34 g/dL (ref 30.0–36.0)
MCV: 89.6 fL (ref 80.0–100.0)
MCV: 89.8 fL (ref 80.0–100.0)
MCV: 88.5 fL (ref 80.0–100.0)
MCV: 87.7 fL (ref 80.0–100.0)
MCV: 88.3 fL (ref 80.0–100.0)
Platelets: 139 10*3/uL — ABNORMAL LOW (ref 150–400)
Platelets: 141 10*3/uL — ABNORMAL LOW (ref 150–400)
Platelets: 101 10*3/uL — ABNORMAL LOW (ref 150–400)
Platelets: 105 10*3/uL — ABNORMAL LOW (ref 150–400)
Platelets: 118 10*3/uL — ABNORMAL LOW (ref 150–400)
RBC: 4.05 MIL/uL (ref 3.87–5.11)
RBC: 4.11 MIL/uL (ref 3.87–5.11)
RBC: 3.49 MIL/uL — ABNORMAL LOW (ref 3.87–5.11)
RBC: 3.24 MIL/uL — ABNORMAL LOW (ref 3.87–5.11)
RBC: 3.26 MIL/uL — ABNORMAL LOW (ref 3.87–5.11)
RDW: 14.1 % (ref 11.5–15.5)
RDW: 14.2 % (ref 11.5–15.5)
RDW: 14.5 % (ref 11.5–15.5)
RDW: 14.5 % (ref 11.5–15.5)
RDW: 14.6 % (ref 11.5–15.5)
WBC: 17.2 10*3/uL — ABNORMAL HIGH (ref 4.0–10.5)
WBC: 17.6 10*3/uL — ABNORMAL HIGH (ref 4.0–10.5)
WBC: 12.1 10*3/uL — ABNORMAL HIGH (ref 4.0–10.5)
WBC: 13.7 10*3/uL — ABNORMAL HIGH (ref 4.0–10.5)
WBC: 16.8 10*3/uL — ABNORMAL HIGH (ref 4.0–10.5)
nRBC: 0.8 % — ABNORMAL HIGH (ref 0.0–0.2)
nRBC: 0.9 % — ABNORMAL HIGH (ref 0.0–0.2)
nRBC: 1.3 % — ABNORMAL HIGH (ref 0.0–0.2)
nRBC: 1.1 % — ABNORMAL HIGH (ref 0.0–0.2)
nRBC: 0.9 % — ABNORMAL HIGH (ref 0.0–0.2)

## 2018-08-17 LAB — FIBRINOGEN: Fibrinogen: 363 mg/dL (ref 210–475)

## 2018-08-17 LAB — ABO/RH: ABO/RH(D): A POS

## 2018-08-17 LAB — APTT
aPTT: 29 seconds (ref 24–36)
aPTT: 31 seconds (ref 24–36)

## 2018-08-17 LAB — DIFFERENTIAL
Abs Immature Granulocytes: 0.34 10*3/uL — ABNORMAL HIGH (ref 0.00–0.07)
Basophils Absolute: 0.1 10*3/uL (ref 0.0–0.1)
Basophils Relative: 0 %
Eosinophils Absolute: 0.2 10*3/uL (ref 0.0–0.5)
Eosinophils Relative: 1 %
Immature Granulocytes: 2 %
Lymphocytes Relative: 10 %
Lymphs Abs: 1.7 10*3/uL (ref 0.7–4.0)
Monocytes Absolute: 1.2 10*3/uL — ABNORMAL HIGH (ref 0.1–1.0)
Monocytes Relative: 7 %
Neutro Abs: 14.1 10*3/uL — ABNORMAL HIGH (ref 1.7–7.7)
Neutrophils Relative %: 80 %

## 2018-08-17 LAB — URINALYSIS, ROUTINE W REFLEX MICROSCOPIC
Bilirubin Urine: NEGATIVE
Glucose, UA: NEGATIVE mg/dL
Hgb urine dipstick: NEGATIVE
Ketones, ur: NEGATIVE mg/dL
Leukocytes,Ua: NEGATIVE
Nitrite: NEGATIVE
Protein, ur: 30 mg/dL — AB
Specific Gravity, Urine: 1.014 (ref 1.005–1.030)
pH: 6 (ref 5.0–8.0)

## 2018-08-17 LAB — TROPONIN I: Troponin I: 0.05 ng/mL (ref <0.03)

## 2018-08-17 LAB — SARS CORONAVIRUS 2 BY RT PCR (HOSPITAL ORDER, PERFORMED IN ~~LOC~~ HOSPITAL LAB): SARS Coronavirus 2: NEGATIVE

## 2018-08-17 LAB — PROTEIN / CREATININE RATIO, URINE
Creatinine, Urine: 134.96 mg/dL
Protein Creatinine Ratio: 0.5 mg/mg{Cre} — ABNORMAL HIGH (ref 0.00–0.15)
Total Protein, Urine: 67 mg/dL

## 2018-08-17 LAB — FETAL FIBRONECTIN: Fetal Fibronectin: NEGATIVE

## 2018-08-17 LAB — LACTATE DEHYDROGENASE: LDH: 1249 U/L — ABNORMAL HIGH (ref 98–192)

## 2018-08-17 SURGERY — Surgical Case
Anesthesia: Spinal

## 2018-08-17 MED ORDER — IBUPROFEN 800 MG PO TABS: 800.0000 mg | ORAL_TABLET | Freq: Four times a day (QID) | ORAL | Status: DC

## 2018-08-17 MED ORDER — BUPIVACAINE IN DEXTROSE 0.75-8.25 % IT SOLN
INTRATHECAL | Status: DC | PRN
  Administered 2018-08-17: 1.5 mL via INTRATHECAL

## 2018-08-17 MED ORDER — MENTHOL 3 MG MT LOZG: 1.0000 | LOZENGE | OROMUCOSAL | Status: DC | PRN

## 2018-08-17 MED ORDER — LACTATED RINGERS IV SOLN
INTRAVENOUS | Status: DC
  Administered 2018-08-17 – 2018-08-18 (×3): via INTRAVENOUS

## 2018-08-17 MED ORDER — LABETALOL HCL 5 MG/ML IV SOLN: 80.0000 mg | INTRAVENOUS | Status: DC | PRN

## 2018-08-17 MED ORDER — OXYCODONE HCL 5 MG/5ML PO SOLN: 5.0000 mg | Freq: Once | ORAL | Status: DC | PRN

## 2018-08-17 MED ORDER — OXYTOCIN 40 UNITS IN NORMAL SALINE INFUSION - SIMPLE MED
INTRAVENOUS | Status: AC
  Filled 2018-08-17: qty 1000

## 2018-08-17 MED ORDER — KETOROLAC TROMETHAMINE 30 MG/ML IJ SOLN: 30.0000 mg | Freq: Four times a day (QID) | INTRAMUSCULAR | Status: DC | PRN

## 2018-08-17 MED ORDER — OXYTOCIN 40 UNITS IN NORMAL SALINE INFUSION - SIMPLE MED: 2.5000 [IU]/h | INTRAVENOUS | Status: AC

## 2018-08-17 MED ORDER — HYDROMORPHONE HCL 1 MG/ML IJ SOLN
1.0000 mg | Freq: Once | INTRAMUSCULAR | Status: AC
  Administered 2018-08-17: 1 mg via INTRAVENOUS
  Filled 2018-08-17: qty 1

## 2018-08-17 MED ORDER — ONDANSETRON HCL 4 MG/2ML IJ SOLN
INTRAMUSCULAR | Status: DC | PRN
  Administered 2018-08-17: 4 mg via INTRAVENOUS

## 2018-08-17 MED ORDER — MAGNESIUM SULFATE BOLUS VIA INFUSION
4.0000 g | Freq: Once | INTRAVENOUS | Status: AC
  Administered 2018-08-17: 4 g via INTRAVENOUS
  Filled 2018-08-17: qty 500

## 2018-08-17 MED ORDER — MEPERIDINE HCL 25 MG/ML IJ SOLN: 6.2500 mg | INTRAMUSCULAR | Status: DC | PRN

## 2018-08-17 MED ORDER — MORPHINE SULFATE (PF) 0.5 MG/ML IJ SOLN
INTRAMUSCULAR | Status: DC | PRN
  Administered 2018-08-17: .15 mg via INTRATHECAL

## 2018-08-17 MED ORDER — ACETAMINOPHEN 160 MG/5ML PO SOLN: 325.0000 mg | ORAL | Status: DC | PRN

## 2018-08-17 MED ORDER — SIMETHICONE 80 MG PO CHEW: 80.0000 mg | CHEWABLE_TABLET | ORAL | Status: DC | PRN

## 2018-08-17 MED ORDER — SODIUM CHLORIDE 0.9 % IR SOLN
Status: DC | PRN
  Administered 2018-08-17: 1000 mL

## 2018-08-17 MED ORDER — PRENATAL MULTIVITAMIN CH
1.0000 | ORAL_TABLET | Freq: Every day | ORAL | Status: DC
  Administered 2018-08-18 – 2018-08-20 (×3): 1 via ORAL
  Filled 2018-08-17 (×3): qty 1

## 2018-08-17 MED ORDER — SODIUM CHLORIDE 0.9 % IV SOLN
INTRAVENOUS | Status: DC | PRN
  Administered 2018-08-17: 40 [IU] via INTRAVENOUS

## 2018-08-17 MED ORDER — SIMETHICONE 80 MG PO CHEW
80.0000 mg | CHEWABLE_TABLET | Freq: Three times a day (TID) | ORAL | Status: DC
  Administered 2018-08-17 – 2018-08-20 (×9): 80 mg via ORAL
  Filled 2018-08-17 (×9): qty 1

## 2018-08-17 MED ORDER — PHENYLEPHRINE HCL-NACL 20-0.9 MG/250ML-% IV SOLN
INTRAVENOUS | Status: AC
  Filled 2018-08-17: qty 250

## 2018-08-17 MED ORDER — PHENYLEPHRINE HCL-NACL 20-0.9 MG/250ML-% IV SOLN
INTRAVENOUS | Status: DC | PRN
  Administered 2018-08-17: 60 ug/min via INTRAVENOUS

## 2018-08-17 MED ORDER — LABETALOL HCL 5 MG/ML IV SOLN: 40.0000 mg | INTRAVENOUS | Status: DC | PRN

## 2018-08-17 MED ORDER — ONDANSETRON HCL 4 MG/2ML IJ SOLN: 4.0000 mg | Freq: Three times a day (TID) | INTRAMUSCULAR | Status: DC | PRN

## 2018-08-17 MED ORDER — LACTATED RINGERS IV SOLN: INTRAVENOUS | Status: DC

## 2018-08-17 MED ORDER — OXYCODONE HCL 5 MG PO TABS: 5.0000 mg | ORAL_TABLET | ORAL | Status: DC | PRN

## 2018-08-17 MED ORDER — ONDANSETRON HCL 4 MG/2ML IJ SOLN: 4.0000 mg | Freq: Once | INTRAMUSCULAR | Status: DC | PRN

## 2018-08-17 MED ORDER — METOCLOPRAMIDE HCL 5 MG/ML IJ SOLN
10.0000 mg | Freq: Once | INTRAMUSCULAR | Status: AC
  Administered 2018-08-17: 10 mg via INTRAVENOUS
  Filled 2018-08-17: qty 2

## 2018-08-17 MED ORDER — DIBUCAINE (PERIANAL) 1 % EX OINT: 1.0000 "application " | TOPICAL_OINTMENT | CUTANEOUS | Status: DC | PRN

## 2018-08-17 MED ORDER — SOD CITRATE-CITRIC ACID 500-334 MG/5ML PO SOLN
30.0000 mL | Freq: Once | ORAL | Status: AC
  Administered 2018-08-17: 30 mL via ORAL
  Filled 2018-08-17: qty 30

## 2018-08-17 MED ORDER — SODIUM CHLORIDE 0.9 % IR SOLN
Status: DC | PRN
  Administered 2018-08-17: 600 mL

## 2018-08-17 MED ORDER — NALBUPHINE HCL 10 MG/ML IJ SOLN: 5.0000 mg | Freq: Once | INTRAMUSCULAR | Status: DC | PRN

## 2018-08-17 MED ORDER — FUROSEMIDE 10 MG/ML IJ SOLN
INTRAMUSCULAR | Status: AC
  Filled 2018-08-17: qty 2

## 2018-08-17 MED ORDER — FUROSEMIDE 10 MG/ML IJ SOLN
INTRAMUSCULAR | Status: DC | PRN
  Administered 2018-08-17: 10 mg via INTRAVENOUS

## 2018-08-17 MED ORDER — DIPHENHYDRAMINE HCL 50 MG/ML IJ SOLN: 12.5000 mg | INTRAMUSCULAR | Status: DC | PRN

## 2018-08-17 MED ORDER — NALOXONE HCL 4 MG/10ML IJ SOLN
1.0000 ug/kg/h | INTRAVENOUS | Status: DC | PRN
  Filled 2018-08-17: qty 5

## 2018-08-17 MED ORDER — KETOROLAC TROMETHAMINE 30 MG/ML IJ SOLN: 30.0000 mg | Freq: Four times a day (QID) | INTRAMUSCULAR | Status: DC

## 2018-08-17 MED ORDER — WITCH HAZEL-GLYCERIN EX PADS: 1.0000 "application " | MEDICATED_PAD | CUTANEOUS | Status: DC | PRN

## 2018-08-17 MED ORDER — ZOLPIDEM TARTRATE 5 MG PO TABS: 5.0000 mg | ORAL_TABLET | Freq: Every evening | ORAL | Status: DC | PRN

## 2018-08-17 MED ORDER — LACTATED RINGERS IV SOLN
INTRAVENOUS | Status: DC | PRN
  Administered 2018-08-17 (×2): via INTRAVENOUS

## 2018-08-17 MED ORDER — SCOPOLAMINE 1 MG/3DAYS TD PT72
1.0000 | MEDICATED_PATCH | Freq: Once | TRANSDERMAL | Status: DC
  Filled 2018-08-17: qty 1

## 2018-08-17 MED ORDER — OXYCODONE HCL 5 MG PO TABS: 5.0000 mg | ORAL_TABLET | Freq: Once | ORAL | Status: DC | PRN

## 2018-08-17 MED ORDER — SIMETHICONE 80 MG PO CHEW
80.0000 mg | CHEWABLE_TABLET | ORAL | Status: DC
  Administered 2018-08-17 – 2018-08-19 (×3): 80 mg via ORAL
  Filled 2018-08-17 (×3): qty 1

## 2018-08-17 MED ORDER — DIPHENHYDRAMINE HCL 25 MG PO CAPS: 25.0000 mg | ORAL_CAPSULE | Freq: Four times a day (QID) | ORAL | Status: DC | PRN

## 2018-08-17 MED ORDER — FENTANYL CITRATE (PF) 100 MCG/2ML IJ SOLN
INTRAMUSCULAR | Status: DC | PRN
  Administered 2018-08-17: 15 ug via INTRATHECAL

## 2018-08-17 MED ORDER — LABETALOL HCL 5 MG/ML IV SOLN: 20.0000 mg | INTRAVENOUS | Status: DC | PRN

## 2018-08-17 MED ORDER — SCOPOLAMINE 1 MG/3DAYS TD PT72
MEDICATED_PATCH | TRANSDERMAL | Status: DC | PRN
  Administered 2018-08-17: 1 via TRANSDERMAL

## 2018-08-17 MED ORDER — DEXAMETHASONE SODIUM PHOSPHATE 4 MG/ML IJ SOLN
INTRAMUSCULAR | Status: DC | PRN
  Administered 2018-08-17: 4 mg via INTRAVENOUS

## 2018-08-17 MED ORDER — MAGNESIUM SULFATE 40 G IN LACTATED RINGERS - SIMPLE
2.0000 g/h | INTRAVENOUS | Status: DC
  Administered 2018-08-17: 2 g/h via INTRAVENOUS
  Filled 2018-08-17: qty 500

## 2018-08-17 MED ORDER — HYDROCODONE-ACETAMINOPHEN 10-325 MG PO TABS: 1.0000 | ORAL_TABLET | Freq: Four times a day (QID) | ORAL | Status: DC | PRN

## 2018-08-17 MED ORDER — ONDANSETRON HCL 4 MG/2ML IJ SOLN
INTRAMUSCULAR | Status: AC
  Filled 2018-08-17: qty 2

## 2018-08-17 MED ORDER — NALBUPHINE HCL 10 MG/ML IJ SOLN: 5.0000 mg | INTRAMUSCULAR | Status: DC | PRN

## 2018-08-17 MED ORDER — MORPHINE SULFATE (PF) 0.5 MG/ML IJ SOLN
INTRAMUSCULAR | Status: AC
  Filled 2018-08-17: qty 10

## 2018-08-17 MED ORDER — ACETAMINOPHEN 325 MG PO TABS: 325.0000 mg | ORAL_TABLET | ORAL | Status: DC | PRN

## 2018-08-17 MED ORDER — TETANUS-DIPHTH-ACELL PERTUSSIS 5-2.5-18.5 LF-MCG/0.5 IM SUSP: 0.5000 mL | Freq: Once | INTRAMUSCULAR | Status: DC

## 2018-08-17 MED ORDER — NALOXONE HCL 0.4 MG/ML IJ SOLN: 0.4000 mg | INTRAMUSCULAR | Status: DC | PRN

## 2018-08-17 MED ORDER — HYDROCODONE-ACETAMINOPHEN 5-325 MG PO TABS
1.0000 | ORAL_TABLET | ORAL | Status: DC | PRN
  Administered 2018-08-18 – 2018-08-20 (×4): 1 via ORAL
  Filled 2018-08-17 (×4): qty 1

## 2018-08-17 MED ORDER — SODIUM CHLORIDE 0.9 % IV SOLN
INTRAVENOUS | Status: DC | PRN
  Administered 2018-08-17: 06:00:00 via INTRAVENOUS

## 2018-08-17 MED ORDER — HYDROMORPHONE HCL 1 MG/ML IJ SOLN
0.5000 mg | INTRAMUSCULAR | Status: DC | PRN
  Administered 2018-08-17 – 2018-08-18 (×4): 0.5 mg via INTRAVENOUS
  Filled 2018-08-17 (×4): qty 0.5

## 2018-08-17 MED ORDER — DEXAMETHASONE SODIUM PHOSPHATE 4 MG/ML IJ SOLN
INTRAMUSCULAR | Status: AC
  Filled 2018-08-17: qty 1

## 2018-08-17 MED ORDER — DIPHENHYDRAMINE HCL 25 MG PO CAPS: 25.0000 mg | ORAL_CAPSULE | ORAL | Status: DC | PRN

## 2018-08-17 MED ORDER — FENTANYL CITRATE (PF) 100 MCG/2ML IJ SOLN: 25.0000 ug | INTRAMUSCULAR | Status: DC | PRN

## 2018-08-17 MED ORDER — FAMOTIDINE IN NACL 20-0.9 MG/50ML-% IV SOLN
20.0000 mg | Freq: Once | INTRAVENOUS | Status: AC
  Administered 2018-08-17: 03:00:00 20 mg via INTRAVENOUS
  Filled 2018-08-17: qty 50

## 2018-08-17 MED ORDER — FENTANYL CITRATE (PF) 100 MCG/2ML IJ SOLN
INTRAMUSCULAR | Status: AC
  Filled 2018-08-17: qty 2

## 2018-08-17 MED ORDER — HYDRALAZINE HCL 20 MG/ML IJ SOLN: 10.0000 mg | INTRAMUSCULAR | Status: DC | PRN

## 2018-08-17 MED ORDER — CEFAZOLIN SODIUM-DEXTROSE 2-4 GM/100ML-% IV SOLN
2.0000 g | Freq: Once | INTRAVENOUS | Status: AC
  Administered 2018-08-17: 2 g via INTRAVENOUS
  Filled 2018-08-17: qty 100

## 2018-08-17 MED ORDER — SODIUM CHLORIDE 0.9% FLUSH: 3.0000 mL | INTRAVENOUS | Status: DC | PRN

## 2018-08-17 MED ORDER — SENNOSIDES-DOCUSATE SODIUM 8.6-50 MG PO TABS
2.0000 | ORAL_TABLET | ORAL | Status: DC
  Administered 2018-08-17 – 2018-08-19 (×3): 2 via ORAL
  Filled 2018-08-17 (×3): qty 2

## 2018-08-17 MED ORDER — COCONUT OIL OIL: 1.0000 "application " | TOPICAL_OIL | Status: DC | PRN

## 2018-08-17 SURGICAL SUPPLY — 43 items
APL SKNCLS STERI-STRIP NONHPOA (GAUZE/BANDAGES/DRESSINGS) ×1
BENZOIN TINCTURE PRP APPL 2/3 (GAUZE/BANDAGES/DRESSINGS) ×3 IMPLANT
CHLORAPREP W/TINT 26ML (MISCELLANEOUS) ×3 IMPLANT
CLAMP CORD UMBIL (MISCELLANEOUS) IMPLANT
CLOSURE STERI-STRIP 1/2X4 (GAUZE/BANDAGES/DRESSINGS) ×1
CLOSURE WOUND 1/2 X4 (GAUZE/BANDAGES/DRESSINGS) ×1
CLOTH BEACON ORANGE TIMEOUT ST (SAFETY) ×3 IMPLANT
CLSR STERI-STRIP ANTIMIC 1/2X4 (GAUZE/BANDAGES/DRESSINGS) ×1 IMPLANT
DRAPE C SECTION CLR SCREEN (DRAPES) IMPLANT
DRSG OPSITE POSTOP 4X10 (GAUZE/BANDAGES/DRESSINGS) ×3 IMPLANT
ELECT REM PT RETURN 9FT ADLT (ELECTROSURGICAL) ×3
ELECTRODE REM PT RTRN 9FT ADLT (ELECTROSURGICAL) ×1 IMPLANT
EXTRACTOR VACUUM M CUP 4 TUBE (SUCTIONS) IMPLANT
EXTRACTOR VACUUM M CUP 4' TUBE (SUCTIONS)
GLOVE BIO SURGEON STRL SZ7.5 (GLOVE) ×3 IMPLANT
GLOVE BIOGEL PI IND STRL 7.0 (GLOVE) ×1 IMPLANT
GLOVE BIOGEL PI INDICATOR 7.0 (GLOVE) ×2
GOWN STRL REUS W/TWL 2XL LVL3 (GOWN DISPOSABLE) ×3 IMPLANT
GOWN STRL REUS W/TWL LRG LVL3 (GOWN DISPOSABLE) ×6 IMPLANT
KIT ABG SYR 3ML LUER SLIP (SYRINGE) IMPLANT
NDL HYPO 25X5/8 SAFETYGLIDE (NEEDLE) IMPLANT
NEEDLE HYPO 22GX1.5 SAFETY (NEEDLE) ×3 IMPLANT
NEEDLE HYPO 25X5/8 SAFETYGLIDE (NEEDLE) IMPLANT
NS IRRIG 1000ML POUR BTL (IV SOLUTION) ×3 IMPLANT
PACK C SECTION WH (CUSTOM PROCEDURE TRAY) ×3 IMPLANT
PAD OB MATERNITY 4.3X12.25 (PERSONAL CARE ITEMS) ×3 IMPLANT
PENCIL SMOKE EVAC W/HOLSTER (ELECTROSURGICAL) ×3 IMPLANT
RTRCTR C-SECT PINK 25CM LRG (MISCELLANEOUS) ×3 IMPLANT
STRIP CLOSURE SKIN 1/2X4 (GAUZE/BANDAGES/DRESSINGS) ×2 IMPLANT
SUT CHROMIC 1 CTX 36 (SUTURE) ×8 IMPLANT
SUT VIC AB 1 CT1 36 (SUTURE) ×6 IMPLANT
SUT VIC AB 2-0 CT1 (SUTURE) ×3 IMPLANT
SUT VIC AB 2-0 CT1 27 (SUTURE) ×3
SUT VIC AB 2-0 CT1 TAPERPNT 27 (SUTURE) ×1 IMPLANT
SUT VIC AB 3-0 CT1 27 (SUTURE) ×6
SUT VIC AB 3-0 CT1 TAPERPNT 27 (SUTURE) ×2 IMPLANT
SUT VIC AB 3-0 SH 27 (SUTURE)
SUT VIC AB 3-0 SH 27X BRD (SUTURE) IMPLANT
SUT VIC AB 4-0 KS 27 (SUTURE) ×3 IMPLANT
SYR BULB IRRIGATION 50ML (SYRINGE) IMPLANT
TOWEL OR 17X24 6PK STRL BLUE (TOWEL DISPOSABLE) ×3 IMPLANT
TRAY FOLEY W/BAG SLVR 14FR LF (SET/KITS/TRAYS/PACK) ×3 IMPLANT
WATER STERILE IRR 1000ML POUR (IV SOLUTION) ×3 IMPLANT

## 2018-08-17 NOTE — Anesthesia Preprocedure Evaluation (Signed)
Anesthesia Evaluation  Patient identified by MRN, date of birth, ID band Patient awake    Reviewed: Allergy & Precautions, H&P , NPO status , Patient's Chart, lab work & pertinent test results, reviewed documented beta blocker date and time   Airway Mallampati: II  TM Distance: >3 FB Neck ROM: full    Dental no notable dental hx.    Pulmonary neg pulmonary ROS, former smoker,    Pulmonary exam normal breath sounds clear to auscultation       Cardiovascular hypertension, Normal cardiovascular exam Rhythm:regular Rate:Normal  HELLP (hemolytic anemia/elev liver enzymes/low platelets in pregnancy) Severe preeclampsia     Neuro/Psych negative neurological ROS  negative psych ROS   GI/Hepatic negative GI ROS, Neg liver ROS,   Endo/Other  negative endocrine ROS  Renal/GU negative Renal ROS  negative genitourinary   Musculoskeletal   Abdominal   Peds  Hematology  (+) Blood dyscrasia, anemia ,   Anesthesia Other Findings   Reproductive/Obstetrics (+) Pregnancy                             Anesthesia Physical Anesthesia Plan  ASA: III and emergent  Anesthesia Plan: Spinal   Post-op Pain Management:    Induction:   PONV Risk Score and Plan: 2 and Treatment may vary due to age or medical condition  Airway Management Planned: Nasal Cannula  Additional Equipment:   Intra-op Plan:   Post-operative Plan:   Informed Consent: I have reviewed the patients History and Physical, chart, labs and discussed the procedure including the risks, benefits and alternatives for the proposed anesthesia with the patient or authorized representative who has indicated his/her understanding and acceptance.       Plan Discussed with: Anesthesiologist, CRNA and Surgeon  Anesthesia Plan Comments: (  )        Anesthesia Quick Evaluation

## 2018-08-17 NOTE — Discharge Summary (Addendum)
Obstetrics Discharge Summary OB/GYN Faculty Practice   Patient Name: Dawn Mayo DOB: 1985-12-01 MRN: [id number]  Date of admission: 08/17/2018 Delivering MD: Arlina Robes, MD   Dawn Mayo, Dawn Mayo [[id number]]  Dawn Mayo [[id number]]  Arlina Robes L  Date of discharge: 08/20/2018  Admitting diagnosis: 28 wks nausea vomiting abd pain back pain  Intrauterine pregnancy: [redacted]w[redacted]d     Secondary diagnosis:   Principal Problem:   Cesarean delivery delivered Active Problems:   Antepartum multigravida of advanced maternal age   Uterine scar from previous surgery affecting pregnancy   Uterine fibroids affecting pregnancy   Pregnancy, twin, antepartum   HELLP (hemolytic anemia/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 [redacted]w[redacted]d who was admitted for primary Cesarean section for HELLP syndrome and severe preeclampsia in setting of mono-di twin gestation with intrauterine growth restriction and absent end-diastolic flow. Her pregnancy was complicated by above noted. Delivery was uncomplicated. Please see delivery/op note for additional details. She had received betamethasone 5/14-5/15 in setting of absent end-diastolic flow. She was started on magnesium just prior to her C/S and continued for 24-hours postpartum. Her labs pre-operatively were notable for AST/ALT 635/564 and platelets 141 consistent with HELLP syndrome. Her postpartum course was uncomplicated. Her labs trended to AST/ALT 136/127 and PLT 124 and her blood pressure was in the 130/80s consistently and she was started on enalapril on  the day of discharge. By day of discharge, she was passing flatus, urinating, eating and drinking without difficulty. Her pain was well-controlled, and she was discharged home. She will follow-up in 1 week for telehealth BP check and has a cuff at home.   Physical exam  Vitals:   08/19/18 2317 08/20/18 0420 08/20/18 0800 08/20/18 1200  BP: 136/88 133/79 132/85 118/85  Pulse: 65 67 83 88  Resp: 17 17 17 18   Temp: 98.1 F (36.7 C) 98.3 F (36.8 C) 98.4 F (36.9 C) 98.3 F (36.8 C)  TempSrc: Oral Oral Oral Oral  SpO2: 99% 98% 98% 99%  Height:       General: Well appearing Lochia: appropriate Uterine Fundus: firm Incision: Healing well with no significant drainage DVT Evaluation: No evidence of DVT seen on physical exam. Labs: Lab Results  Component Value Date   WBC 15.2 (H) 08/18/2018   HGB 9.6 (L) 08/18/2018   HCT 28.2 (L) 08/18/2018   MCV 88.1 08/18/2018   PLT 124 (L) 08/18/2018   CMP Latest Ref Rng & Units 08/18/2018  Glucose 70 - 99 mg/dL 145(H)  BUN 6 - 20 mg/dL 8  Creatinine 0.44 - 1.00 mg/dL 0.95  Sodium 135 - 145 mmol/L 138  Potassium 3.5 - 5.1 mmol/L 3.7  Chloride 98 - 111 mmol/L 103  CO2 22 - 32 mmol/L 25  Calcium 8.9 - 10.3 mg/dL 7.1(L)  Total Protein 6.5 - 8.1 g/dL 5.7(L)  Total Bilirubin 0.3 - 1.2 mg/dL 0.3  Alkaline Phos 38 - 126 U/L 103  AST 15 - 41 U/L 136(H)  ALT 0 - 44 U/L 219(H)    Discharge instructions: Per After Visit Summary and "Baby and Me Booklet"  After visit meds:  Enalapril 5mg  daily   Postpartum contraception: Undecided Diet: Routine Diet Activity: Advance as tolerated. Pelvic rest for 6 weeks.   Follow-up Appt: Future Appointments  Date Time Provider [place]  08/26/2018  9:30 AM [facility] NURSE [facility] None   Follow-up Visit:No follow-ups on file.  Please schedule this patient for Postpartum visit in: 4 weeks with the following provider: Any provider For C/S patients schedule nurse incision check in weeks 2 weeks:  yes High risk pregnancy complicated by: fetal growth restriction, mono-di twins, preE, HELLP syndrome Delivery mode:  CS Anticipated Birth Control:  other/unsure PP Procedures needed: BP check, incision check  Schedule Integrated BH visit: no  Newborn Data:   Dawn Mayo [[id number]]  Live born female  Birth Weight: 1 lb 14.3 oz (860 g) APGAR: ,   Newborn Delivery   Birth date/time:  08/17/2018 05:30:00 Delivery type:  C-Section, Low Transverse Trial of labor:  No C-section categorization:  Primary      Dawn Mayo [[id number]]  Live born female  Birth Weight: 1 lb 8 oz (680 g) APGAR: ,   Newborn Delivery   Birth date/time:  08/17/2018 05:31:00 Delivery type:  C-Section, Low Transverse Trial of labor:  No C-section categorization:  Primary     Baby Feeding: Breast- pumping  Disposition:NICU

## 2018-08-17 NOTE — Anesthesia Procedure Notes (Signed)
Spinal  Patient location during procedure: OR Start time: 08/17/2018 5:00 AM End time: 08/17/2018 5:05 AM Staffing Anesthesiologist: Janeece Riggers, MD Preanesthetic Checklist Completed: patient identified, site marked, surgical consent, pre-op evaluation, timeout performed, IV checked, risks and benefits discussed and monitors and equipment checked Spinal Block Patient position: sitting Prep: DuraPrep Patient monitoring: heart rate, cardiac monitor, continuous pulse ox and blood pressure Approach: midline Location: L3-4 Injection technique: single-shot Needle Needle type: Sprotte  Needle gauge: 24 G Needle length: 9 cm Assessment Sensory level: T4

## 2018-08-17 NOTE — Progress Notes (Signed)
Faculty Note  Patient POD#0 s/p primary LTCS for HELLP in the setting of mono-di twin pregnancy. Repeat labwork with worsening AST, improved ALT, pending PLT count. Normal clotting factors. BP stable. Cont mag, repeat labs 6 hrs.  Feliz Beam, M.D. Attending Center for [employer])

## 2018-08-17 NOTE — H&P (Signed)
Dawn Mayo is a 33 y.o. female G3P0020 IUP 28 4/7 weeks with mono/di twins presenting for with abd pain. Pt with known IUGR and AEDF on U/S this past Thursday. Received celestone Thursday and Friday.  Pt reports RUQ pain since last evening, no HA or visual changes + FM. No VB or LOF  W/U in MAU consistent with HELLP   OB History    Gravida  3   Para  0   Term  0   Preterm  0   AB  2   Living        SAB  1   TAB  1   Ectopic  0   Multiple      Live Births             Past Medical History:  Diagnosis Date  . Anemia   . Asthma    AS CHILD  . Asthma   . Eczema   . Fibroid   . Fibroids   . Headache   . Ovarian cyst   . Pneumonia    Past Surgical History:  Procedure Laterality Date  . DILATION AND CURETTAGE OF UTERUS    . LAPAROSCOPIC SALPINGO OOPHERECTOMY Right    Due to 15cm cyst  . LAPAROTOMY Bilateral 08/19/2012   Procedure: EXPLORATORY LAPAROTOMY, RIGHT SALPINGOOOPHORECTOMY,  MYOMECTOMY;  Surgeon: Imagene Gurney A. Alycia Rossetti, MD;  Location: [facility] ORS;  Service: Gynecology;  Laterality: Bilateral;  . MYOMECTOMY     Family History: family history includes Arthritis in her maternal grandmother; Asthma in her brother; Cancer in her paternal grandmother; Diabetes in her maternal grandmother; Hypertension in her maternal grandmother; Miscarriages / [place] in her mother; Stroke in her maternal grandmother. Social History:  reports that she has quit smoking. She has never used smokeless tobacco. She reports that she does not drink alcohol or use drugs.     Maternal Diabetes: No Genetic Screening: Normal Maternal Ultrasounds/Referrals: IUGR AEDF  Fetal Ultrasounds or other Referrals:  None Maternal Substance Abuse:  No Significant Maternal Medications:  None Significant Maternal Lab Results:  None Other Comments:  None  Review of Systems  Constitutional: Negative.   Respiratory: Negative.   Cardiovascular: Positive for chest pain.  Gastrointestinal: Positive  for abdominal pain and nausea.  Genitourinary: Negative.    Maternal Medical History:  Reason for admission: Nausea.     Dilation: Closed Exam by:: Noni Saupe NP Blood pressure 128/80, pulse 83, temperature 98.9 F (37.2 C), temperature source Oral, resp. rate 18, last menstrual period 01/29/2018, SpO2 97 %. Exam Physical Exam  Constitutional: She appears well-developed and well-nourished.  Cardiovascular: Normal rate and regular rhythm.  Respiratory: Effort normal and breath sounds normal.  GI: Soft. Bowel sounds are normal.  Right upper quad tenderness gravid  Genitourinary:    Genitourinary Comments: deffered   Musculoskeletal:        General: Edema present.     Comments: No clonus  DTR's nl    Prenatal labs: ABO, Rh: A/Positive/-- (01/28 1432) Antibody: Negative (01/28 1432) Rubella: 1.65 (01/28 1432) RPR: Non Reactive (05/04 0847)  HBsAg: Negative (01/28 1432)  HIV: Non Reactive (05/04 0847)  GBS:     Assessment/Plan: IUP 28 4/7 weeks Twins Mono/Di HELLP Syndrome  Dx discussed with pt and husband. Pt is s/p BMZ. Delivery now indicated d/t to maternal condition. Has been NPO since 5 PM. R/B/Post op care reviewed with pt and husband. Nursing, Anesthesia and NICU notified. Will begin Magnesium and proceed to OR.  Chancy Milroy 08/17/2018, 4:15 AM

## 2018-08-17 NOTE — MAU Provider Note (Signed)
History     CSN: [id number]  Arrival date and time: 08/17/18 3664   First Provider Initiated Contact with Patient 08/17/18 0159      Chief Complaint  Patient presents with  . Nausea  . Emesis  . Abdominal Pain  . Back Pain   HPI   Ms.Dawn Mayo is a 33 y.o. female with DI/DI twins G3P0020 @ [redacted]w[redacted]d here with pain. Her pregnancy has been complicated by IUGR with recent abnormal dopplers. Around 8 Pm she started having pain in her chest, upper stomach and back. The pain is all in her upper abdomen/ upper back. The pain in her upper abdomen is worse in the RUQ. The pain does not radiate down her arm.   For dinner she ate an omlette , grits and bacon and 3 hours after that she vomited. She has vomited countless times.  No sick contracts. She took Zofran, which helped some, however did not help her pain.  No issues with her BP in the pregnancy.   No HA, No scotoma.  + fetal movement.   OB History    Gravida  3   Para  0   Term  0   Preterm  0   AB  2   Living        SAB  1   TAB  1   Ectopic  0   Multiple      Live Births              Past Medical History:  Diagnosis Date  . Anemia   . Asthma    AS CHILD  . Asthma   . Eczema   . Fibroid   . Fibroids   . Headache   . Ovarian cyst   . Pneumonia     Past Surgical History:  Procedure Laterality Date  . DILATION AND CURETTAGE OF UTERUS    . LAPAROSCOPIC SALPINGO OOPHERECTOMY Right    Due to 15cm cyst  . LAPAROTOMY Bilateral 08/19/2012   Procedure: EXPLORATORY LAPAROTOMY, RIGHT SALPINGOOOPHORECTOMY,  MYOMECTOMY;  Surgeon: Imagene Gurney A. Alycia Rossetti, MD;  Location: [facility];  Service: Gynecology;  Laterality: Bilateral;  . MYOMECTOMY      Family History  Problem Relation Age of Onset  . Miscarriages / [place] Mother   . Asthma Brother   . Arthritis Maternal Grandmother   . Diabetes Maternal Grandmother   . Hypertension Maternal Grandmother   . Stroke Maternal Grandmother   . Cancer Paternal Grandmother      Social History   Tobacco Use  . Smoking status: Former Research scientist (life sciences)  . Smokeless tobacco: Never Used  Substance Use Topics  . Alcohol use: Never    Frequency: Never  . Drug use: Never    Allergies: No Known Allergies  Medications Prior to Admission  Medication Sig Dispense Refill Last Dose  . ASPIRIN 81 PO Take by mouth.   08/16/2018 at Unknown time  . ondansetron (ZOFRAN) 4 MG tablet Take 4 mg by mouth every 8 (eight) hours as needed for nausea or vomiting.   08/17/2018 at Unknown time  . Prenatal Vit-Fe Fumarate-FA (MULTIVITAMIN-PRENATAL) 27-0.8 MG TABS tablet Take 1 tablet by mouth daily at 12 noon.   08/16/2018 at Unknown time  . Acetaminophen (TYLENOL PO) Take by mouth.   Taking  . pseudoephedrine-acetaminophen (TYLENOL SINUS) 30-500 MG TABS Take 1 tablet by mouth every 4 (four) hours as needed (cold/congestion).   Not Taking   Results for orders placed or performed during the hospital encounter  of 08/17/18 (from the past 48 hour(s))  Urinalysis, Routine w reflex microscopic     Status: Abnormal   Collection Time: 08/17/18  2:03 AM  Result Value Ref Range   Color, Urine YELLOW YELLOW   APPearance CLEAR CLEAR   Specific Gravity, Urine 1.014 1.005 - 1.030   pH 6.0 5.0 - 8.0   Glucose, UA NEGATIVE NEGATIVE mg/dL   Hgb urine dipstick NEGATIVE NEGATIVE   Bilirubin Urine NEGATIVE NEGATIVE   Ketones, ur NEGATIVE NEGATIVE mg/dL   Protein, ur 30 (A) NEGATIVE mg/dL   Nitrite NEGATIVE NEGATIVE   Leukocytes,Ua NEGATIVE NEGATIVE   RBC / HPF 0-5 0 - 5 RBC/hpf   WBC, UA 0-5 0 - 5 WBC/hpf   Bacteria, UA RARE (A) NONE SEEN   Squamous Epithelial / LPF 0-5 0 - 5   Mucus PRESENT     Comment: Performed at [facility], [address], [place], [place] [address]  Protein / creatinine ratio, urine     Status: Abnormal   Collection Time: 08/17/18  2:03 AM  Result Value Ref Range   Creatinine, Urine 134.96 mg/dL   Total Protein, Urine 67 mg/dL    Comment: NO NORMAL RANGE ESTABLISHED  FOR THIS TEST   Protein Creatinine Ratio 0.50 (H) 0.00 - 0.15 mg/mg[Cre]    Comment: Performed at [facility], [place] [address], [place], [place] [address]  CBC     Status: Abnormal   Collection Time: 08/17/18  2:30 AM  Result Value Ref Range   WBC 17.2 (H) 4.0 - 10.5 K/uL   RBC 4.05 3.87 - 5.11 MIL/uL   Hemoglobin 12.0 12.0 - 15.0 g/dL   HCT 36.3 36.0 - 46.0 %   MCV 89.6 80.0 - 100.0 fL   MCH 29.6 26.0 - 34.0 pg   MCHC 33.1 30.0 - 36.0 g/dL   RDW 14.2 11.5 - 15.5 %   Platelets 139 (L) 150 - 400 K/uL   nRBC 0.8 (H) 0.0 - 0.2 %    Comment: Performed at [facility] [address], [place], [place] [address]  Comprehensive metabolic panel     Status: Abnormal   Collection Time: 08/17/18  2:30 AM  Result Value Ref Range   Sodium 139 135 - 145 mmol/L   Potassium 4.0 3.5 - 5.1 mmol/L   Chloride 106 98 - 111 mmol/L   CO2 20 (L) 22 - 32 mmol/L   Glucose, Bld 99 70 - 99 mg/dL   BUN 11 6 - 20 mg/dL   Creatinine, Ser 0.85 0.44 - 1.00 mg/dL   Calcium 9.1 8.9 - 10.3 mg/dL   Total Protein 7.1 6.5 - 8.1 g/dL   Albumin 3.0 (L) 3.5 - 5.0 g/dL   AST 635 (H) 15 - 41 U/L   ALT 564 (H) 0 - 44 U/L   Alkaline Phosphatase 175 (H) 38 - 126 U/L   Total Bilirubin 1.0 0.3 - 1.2 mg/dL   GFR calc non Af Amer >60 >60 mL/min   GFR calc Af Amer >60 >60 mL/min   Anion gap 13 5 - 15    Comment: Performed at [facility] [address], [place], [place] [address]  Troponin I - Add-On to previous collection     Status: Abnormal   Collection Time: 08/17/18  2:30 AM  Result Value Ref Range   Troponin I 0.05 (HH) <0.03 ng/mL    Comment: CRITICAL RESULT CALLED TO, READ BACK BY AND VERIFIED WITH:  PHILLIPS,S RN 08/17/2018 0359 JORDANS Performed at [facility], [place] [address] [place], [place] [address]   CBC     Status: Abnormal   Collection Time: 08/17/18  2:30 AM  Result Value Ref Range   WBC 17.6 (H) 4.0 - 10.5 K/uL   RBC 4.11 3.87 - 5.11 MIL/uL   Hemoglobin 12.2 12.0 - 15.0  g/dL   HCT 36.9 36.0 - 46.0 %   MCV 89.8 80.0 - 100.0 fL   MCH 29.7 26.0 - 34.0 pg   MCHC 33.1 30.0 - 36.0 g/dL   RDW 14.1 11.5 - 15.5 %   Platelets 141 (L) 150 - 400 K/uL   nRBC 0.9 (H) 0.0 - 0.2 %    Comment: Performed at [facility], [place] [address], [place], [place] [address]  Fetal fibronectin     Status: None   Collection Time: 08/17/18  2:37 AM  Result Value Ref Range   Fetal Fibronectin NEGATIVE NEGATIVE    Comment: Performed at [facility], [place] [address], [place], [place] [address]   Review of Systems  Constitutional: Negative for fever.  Eyes: Negative for photophobia and visual disturbance.  Respiratory: Negative for shortness of breath.   Cardiovascular: Positive for chest pain.  Gastrointestinal: Positive for abdominal pain, nausea and vomiting.  Genitourinary: Negative for vaginal bleeding.  Neurological: Negative for headaches.   Physical Exam   Blood pressure (!) 141/83, pulse 75, temperature 98.9 F (37.2 C), temperature source Oral, resp. rate 18, last menstrual period 01/29/2018, SpO2 100 %.   Patient Vitals for the past 24 hrs:  BP Temp Temp src Pulse Resp SpO2  08/17/18 0400 128/80 - - 83 - 97 %  08/17/18 0330 109/75 - - 75 - -  08/17/18 0325 - - - - - 97 %  08/17/18 0315 118/74 - - 76 - -  08/17/18 0300 119/75 - - 74 - 95 %  08/17/18 0245 130/74 - - 66 - -  08/17/18 0230 (!) 155/85 - - 70 - -  08/17/18 0215 (!) 152/86 - - 69 - -  08/17/18 0210 - - - - - 98 %  08/17/18 0200 (!) 167/85 - - 64 - 99 %  08/17/18 0149 (!) 141/83 98.9 F (37.2 C) Oral 75 18 100 %   Physical Exam  Constitutional: She is oriented to person, place, and time. She appears well-developed and well-nourished. She has a sickly appearance. She appears distressed.  HENT:  Head: Normocephalic.  Eyes: Pupils are equal, round, and reactive to light.  Cardiovascular: Normal rate.  Respiratory: Effort normal and breath sounds normal. No respiratory distress. She has no  wheezes. She has no rales. She exhibits no tenderness.  GI: There is abdominal tenderness in the right upper quadrant. There is guarding.    Musculoskeletal:        General: Edema (1+ non-pitting edema in bilateral lower extremities) present.  Neurological: She is alert and oriented to person, place, and time. She has normal reflexes. She displays normal reflexes.  Negative clonus   Skin: Skin is warm. She is not diaphoretic.  Psychiatric: Her behavior is normal.   Fetal Tracing Fetus A Baseline: 145 bpm Variability: moderate  Accelerations: 10x10 Decelerations: none Toco: initial shows frequent variability, quiet after IVF.  Fetal Tracing Fetus B Baseline: 155 Variability: moderate  Accelerations: 10x10 Decelerations: Variable at 0344   MAU Course  Procedures  None  MDM  Bedside US of RUQ: right pleural effusion  Dilaudid 1 mg given IV Reglan 10 mg IV CBC with diff, CMP, PCR Discussed results with Dr. Rip Harbour Pain now 2/10 following Dilaudid.  Assessment and Plan   A:  1. 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 Severe pain     P:  To the OR per Dr. Rip Harbour Dr. Rip Harbour to MAU Magnesium started BMZ given 5/14 & 5/15   Manning Luna, Anderson Malta I, NP 08/17/2018 4:20 AM

## 2018-08-17 NOTE — Lactation Note (Signed)
This note was copied from a baby's chart. Lactation Consultation Note  Patient Name: Dawn Mayo Date: 08/17/2018 Reason for consult: Initial assessment;1st time breastfeeding;Primapara;Multiple gestation;Infant < 6lbs;Preterm <34wks;NICU baby  Visited with mom of 54 hours old pre-term NICU twins. Mom has HELLP syndrome, and she's been very sick. RN was getting pump equipment when entering the room, reviewed pump instructions, cleaning and storage with mom; as well as milk storage guidelines. This is her first pregnancy and first set of babies, she didn't have a change to attend BF classes during the pregnancy but participated in the [facility] program at the [facility].   LC reviewed hand expression with mom but unable to get colostrum at this point. However, mom reported (+) breast changes during the pregnancy. She also showed LC her third nipple, she has an accessory nipple on her left breast along the milk line. Mom also reported (+) breast changes for it, she said it looked like a "mole" before the pregnancy and now it looks a lot bigger and puffier like an actual/small nipple. Reviewed benefits of premature milk in comparison to term milk. Even though mom would like to do both, breast and formula, she'd like to do as much breast ad possible.  Even though mom was very tired she was also very proactive and wanted to start pumping right away while on [facility] consultation. LC assisted with that, and let her know that the purpose of pumping at this early stage is mainly for breast stimulation and not to get volume, she voiced understanding, praised her for her efforts.  Feeding plan:  1. Encouraged mom every 3 hours, she'll aim for 8 pumping sessions/24 hours that but will go on her own pace until she starts feeding better 2. Once she starts getting drops of colostrum, she'll use the bullets/labels provided and will take it to the NICU  BF brochure, BF resources and NICU booklet were reviewed. Parents  reported all questions and concerns were answered, they're both aware of [facility] services and will call PRN.  Maternal Data Formula Feeding for Exclusion: Yes Reason for exclusion: Mother's choice to formula and breast feed on admission Has patient been taught Hand Expression?: Yes Does the patient have breastfeeding experience prior to this delivery?: No  Feeding    Interventions Interventions: Breast feeding basics reviewed;Breast massage;Hand express;Breast compression;DEBP  Lactation Tools Discussed/Used WIC Program: Yes Pump Review: Setup, frequency, and cleaning;Milk Storage Initiated by:: RN and IBCLC Date initiated:: 08/17/18   Consult Status Consult Status: PRN    Braylee Bosher S Melisa Donofrio 08/17/2018, 7:03 PM

## 2018-08-17 NOTE — Progress Notes (Signed)
Pt. Slowly progressing. HELLP labs show plts increasing and liver enzymes lowering (however still abnormal). Pt. Ambulated in room to wheelchair, with generalized weakness, will keep foley in until PM as pt. States she feels weak to ambulate to the bathroom currently but will keep trying with OOB.  Pt. Taken to NICU.  Lactation in room to help with teaching and DEBP.  Pt. Tolerating regular diet in small amounts.  IS taught to both pt. And SO.

## 2018-08-17 NOTE — Op Note (Signed)
[name] PROCEDURE DATE: 08/17/2018  PREOPERATIVE DIAGNOSES: Intrauterine pregnancy at [redacted]w[redacted]d weeks gestation; HELLP syndrome; Mono-Di Twin Gestation with fetal growth restriction, absent end-diastolic flow  POSTOPERATIVE DIAGNOSES: The same  PROCEDURE: Primary Low Transverse Cesarean Section  SURGEON:  Dr. Arlina Robes ASSISTANT:  Dr. Gerri Lins   ANESTHESIOLOGY TEAM: Anesthesiologist: Janeece Riggers, MD CRNA: Sandrea Matte, CRNA  INDICATIONS: Dawn Mayo is a 33 y.o. G3P0020 at [redacted]w[redacted]d here for cesarean section secondary to the indications listed under preoperative diagnoses; please see preoperative note for further details.  The risks of cesarean section were discussed with the patient including but were not limited to: bleeding which may require transfusion or reoperation; infection which may require antibiotics; injury to bowel, bladder, ureters or other surrounding organs; injury to the fetus; need for additional procedures including hysterectomy in the event of a life-threatening hemorrhage; placental abnormalities wth subsequent pregnancies, incisional problems, thromboembolic phenomenon and other postoperative/anesthesia complications.   The patient concurred with the proposed plan, giving informed written consent for the procedure.    FINDINGS:  Viable female infants in cephalic (Twin A) and breech (Twin B) presentation.  Twin A/B Apgars per delivery summary - each with spontaneous cry after delivery. Clear amniotic fluid.  Intact placenta, three vessel cord.  Normal fallopian tubes and ovaries bilaterally. Uterus with multiple fibroids.   ANESTHESIA: Spinal INTRAVENOUS FLUIDS: 1250 ml   ESTIMATED BLOOD LOSS: 355 ml URINE OUTPUT:  500 ml SPECIMENS: Placenta sent to pathology COMPLICATIONS: None immediate  PROCEDURE IN DETAIL:  The patient preoperatively received intravenous antibiotics and had sequential compression devices applied to her lower extremities.  She was then  taken to the operating room where spinal anesthesia was administered and was found to be adequate. She was then placed in a dorsal supine position with a leftward tilt, and prepped and draped in a sterile manner.  A foley catheter was placed into her bladder and attached to constant gravity.  After an adequate timeout was performed, a Pfannenstiel skin incision was made with scalpel and carried through to the underlying layer of fascia. The fascia was incised in the midline, and this incision was extended bilaterally using the Mayo scissors.  Kocher clamps were applied to the superior aspect of the fascial incision and the underlying rectus muscles were dissected off bluntly.  A similar process was carried out on the inferior aspect of the fascial incision. The rectus muscles were separated in the midline and the peritoneum was entered bluntly. The Alexis self-retaining retractor was introduced into the abdominal cavity.  Attention was turned to the lower uterine segment where a low transverse hysterotomy was made with a scalpel and extended bilaterally bluntly.  Twin A was successfully delivered from a vertex presentation, the cord was clamped and cut immediately, and the infant was handed over to the awaiting neonatology team. Twin B was successfully delivery from a breech presentation, the cord was clamped and cut immediately, and the infant was handed over to the awaiting neonatology team. Uterine massage was then administered, and the placenta delivered intact with a three-vessel cord. The uterus was then cleared of clots and debris.  The hysterotomy was closed with 0 Chromic in a running locked fashion, and an imbricating layer was also placed with 0 Chromic.  Figure-of-eight 0 Chromic serosal stitches were placed to help with hemostasis.  The pelvis was cleared of all clot and debris. Hemostasis was confirmed on all surfaces.  The retractor was removed.  The peritoneum was closed with a 2-0 Vicryl  running  stitch and the rectus muscles were reapproximated using 0 Chromic interrupted stitches. The fascia was then closed using 0 Vicryl in a running fashion.  The subcutaneous layer was irrigated, and the skin was closed with a 4-0 Vicryl subcuticular stitch. The patient tolerated the procedure well. Sponge, instrument and needle counts were correct x 3.  She was taken to the recovery room in stable condition.   Lambert Mody. Juleen China, DO OB/GYN Fellow

## 2018-08-17 NOTE — Transfer of Care (Signed)
Immediate Anesthesia Transfer of Care Note  Patient: Dawn Mayo  Procedure(s) Performed: CESAREAN SECTION (N/A )  Patient Location: PACU  Anesthesia Type:Spinal  Level of Consciousness: awake, alert  and patient cooperative  Airway & Oxygen Therapy: Patient Spontanous Breathing  Post-op Assessment: Report given to RN and Post -op Vital signs reviewed and stable  Post vital signs: Reviewed and stable  Last Vitals:  Vitals Value Taken Time  BP 112/70 08/17/2018  6:20 AM  Temp    Pulse 61 08/17/2018  6:25 AM  Resp 16 08/17/2018  6:25 AM  SpO2 100 % 08/17/2018  6:25 AM  Vitals shown include unvalidated device data.  Last Pain:  Vitals:   08/17/18 0240  TempSrc:   PainSc: 0-No pain         Complications: No apparent anesthesia complications

## 2018-08-17 NOTE — MAU Note (Signed)
Pt reports abdominal pain and a burning chest pain that started tonight. Started vomiting soon after. States she took Zofran around midnight and is no longer having vomiting but still reports burning in chest and upper abdominal pain that radiates into back. Pt denies contractions, LOF, vaginal bleeding. Reports good fetal movement.

## 2018-08-18 ENCOUNTER — Telehealth: Payer: Self-pay

## 2018-08-18 ENCOUNTER — Encounter (HOSPITAL_COMMUNITY): Payer: Self-pay

## 2018-08-18 ENCOUNTER — Ambulatory Visit (HOSPITAL_COMMUNITY): Payer: Medicaid Other

## 2018-08-18 LAB — COMPREHENSIVE METABOLIC PANEL
ALT: 271 U/L — ABNORMAL HIGH (ref 0–44)
ALT: 266 U/L — ABNORMAL HIGH (ref 0–44)
ALT: 219 U/L — ABNORMAL HIGH (ref 0–44)
AST: 222 U/L — ABNORMAL HIGH (ref 15–41)
AST: 199 U/L — ABNORMAL HIGH (ref 15–41)
AST: 136 U/L — ABNORMAL HIGH (ref 15–41)
Albumin: 2.2 g/dL — ABNORMAL LOW (ref 3.5–5.0)
Albumin: 2.4 g/dL — ABNORMAL LOW (ref 3.5–5.0)
Albumin: 2.4 g/dL — ABNORMAL LOW (ref 3.5–5.0)
Alkaline Phosphatase: 105 U/L (ref 38–126)
Alkaline Phosphatase: 115 U/L (ref 38–126)
Alkaline Phosphatase: 103 U/L (ref 38–126)
Anion gap: 8 (ref 5–15)
Anion gap: 9 (ref 5–15)
Anion gap: 10 (ref 5–15)
BUN: 7 mg/dL (ref 6–20)
BUN: 7 mg/dL (ref 6–20)
BUN: 8 mg/dL (ref 6–20)
CO2: 25 mmol/L (ref 22–32)
CO2: 24 mmol/L (ref 22–32)
CO2: 25 mmol/L (ref 22–32)
Calcium: 6.4 mg/dL — CL (ref 8.9–10.3)
Calcium: 6.8 mg/dL — ABNORMAL LOW (ref 8.9–10.3)
Calcium: 7.1 mg/dL — ABNORMAL LOW (ref 8.9–10.3)
Chloride: 102 mmol/L (ref 98–111)
Chloride: 102 mmol/L (ref 98–111)
Chloride: 103 mmol/L (ref 98–111)
Creatinine, Ser: 0.77 mg/dL (ref 0.44–1.00)
Creatinine, Ser: 0.95 mg/dL (ref 0.44–1.00)
Creatinine, Ser: 0.95 mg/dL (ref 0.44–1.00)
GFR calc Af Amer: >60 mL/min (ref >60)
GFR calc Af Amer: >60 mL/min (ref >60)
GFR calc Af Amer: >60 mL/min (ref >60)
GFR calc non Af Amer: >60 mL/min (ref >60)
GFR calc non Af Amer: >60 mL/min (ref >60)
GFR calc non Af Amer: >60 mL/min (ref >60)
Glucose, Bld: 94 mg/dL (ref 70–99)
Glucose, Bld: 116 mg/dL — ABNORMAL HIGH (ref 70–99)
Glucose, Bld: 145 mg/dL — ABNORMAL HIGH (ref 70–99)
Potassium: 3.6 mmol/L (ref 3.5–5.1)
Potassium: 3.7 mmol/L (ref 3.5–5.1)
Potassium: 3.7 mmol/L (ref 3.5–5.1)
Sodium: 135 mmol/L (ref 135–145)
Sodium: 135 mmol/L (ref 135–145)
Sodium: 138 mmol/L (ref 135–145)
Total Bilirubin: 0.3 mg/dL (ref 0.3–1.2)
Total Bilirubin: 0.4 mg/dL (ref 0.3–1.2)
Total Bilirubin: 0.3 mg/dL (ref 0.3–1.2)
Total Protein: 5.5 g/dL — ABNORMAL LOW (ref 6.5–8.1)
Total Protein: 6 g/dL — ABNORMAL LOW (ref 6.5–8.1)
Total Protein: 5.7 g/dL — ABNORMAL LOW (ref 6.5–8.1)

## 2018-08-18 LAB — CBC
HCT: 27.2 % — ABNORMAL LOW (ref 36.0–46.0)
HCT: 29 % — ABNORMAL LOW (ref 36.0–46.0)
HCT: 28.2 % — ABNORMAL LOW (ref 36.0–46.0)
Hemoglobin: 9.1 g/dL — ABNORMAL LOW (ref 12.0–15.0)
Hemoglobin: 9.9 g/dL — ABNORMAL LOW (ref 12.0–15.0)
Hemoglobin: 9.6 g/dL — ABNORMAL LOW (ref 12.0–15.0)
MCH: 29.4 pg (ref 26.0–34.0)
MCH: 29.8 pg (ref 26.0–34.0)
MCH: 30 pg (ref 26.0–34.0)
MCHC: 33.5 g/dL (ref 30.0–36.0)
MCHC: 34.1 g/dL (ref 30.0–36.0)
MCHC: 34 g/dL (ref 30.0–36.0)
MCV: 87.7 fL (ref 80.0–100.0)
MCV: 87.3 fL (ref 80.0–100.0)
MCV: 88.1 fL (ref 80.0–100.0)
Platelets: 110 10*3/uL — ABNORMAL LOW (ref 150–400)
Platelets: 129 10*3/uL — ABNORMAL LOW (ref 150–400)
Platelets: 124 10*3/uL — ABNORMAL LOW (ref 150–400)
RBC: 3.1 MIL/uL — ABNORMAL LOW (ref 3.87–5.11)
RBC: 3.32 MIL/uL — ABNORMAL LOW (ref 3.87–5.11)
RBC: 3.2 MIL/uL — ABNORMAL LOW (ref 3.87–5.11)
RDW: 14.4 % (ref 11.5–15.5)
RDW: 14.3 % (ref 11.5–15.5)
RDW: 14.5 % (ref 11.5–15.5)
WBC: 14.1 10*3/uL — ABNORMAL HIGH (ref 4.0–10.5)
WBC: 15.6 10*3/uL — ABNORMAL HIGH (ref 4.0–10.5)
WBC: 15.2 10*3/uL — ABNORMAL HIGH (ref 4.0–10.5)
nRBC: 0.9 % — ABNORMAL HIGH (ref 0.0–0.2)
nRBC: 1 % — ABNORMAL HIGH (ref 0.0–0.2)
nRBC: 1.1 % — ABNORMAL HIGH (ref 0.0–0.2)

## 2018-08-18 LAB — TYPE AND SCREEN
ABO/RH(D): A POS
Antibody Screen: NEGATIVE

## 2018-08-18 NOTE — Progress Notes (Signed)
LFTs trending down, platelets appear stable at this point. Will cont Q 6 CBC/CMP until am. Cont mag   Feliz Beam, M.D. Attending Center for [employer] Fish farm manager)

## 2018-08-18 NOTE — Telephone Encounter (Signed)
Called patient and left message. Asked her to call the office back we have scheduled her postpartum incision check and bp check for [phone]

## 2018-08-18 NOTE — Lactation Note (Signed)
This note was copied from a baby's chart. Lactation Consultation Note  Patient Name: Dawn Mayo Date: 08/18/2018 Reason for consult: Follow-up assessment;1st time breastfeeding;Primapara;Infant < 6lbs;Multiple gestation;Preterm <34wks;NICU baby  P2 mother whose infant twins are 28+4 weeks, weighing < 6 lbs and in the NICU.   Mother has been using the DEBP every three hours and has now begun to see drops of colostrum.  She has colostrum containers and labels at bedside.  Mother is familiar with hand expression and I encouraged hand expression before/after pumping to help increase milk supply.  She will be getting a "hands free" bra brought to the hospital today so I instructed her on how to do breast compressions during her pumping sessions.    Mother is a [facility] participant in [facility] and does not have a DEBP for home use.  [facility] referral faxed.  Encouraged mother to call for any pumping questions/concerns she may have.  Mother has received the NICU booklet and has no further questions.    Maternal Data Has patient been taught Hand Expression?: Yes Does the patient have breastfeeding experience prior to this delivery?: No  Feeding    LATCH Score                   Interventions    Lactation Tools Discussed/Used WIC Program: Yes Pump Review: Setup, frequency, and cleaning(Reviewed)   Consult Status Consult Status: Follow-up Date: 08/19/18 Follow-up type: In-patient    Jareb Radoncic R Julie Nay 08/18/2018, 1:11 PM

## 2018-08-18 NOTE — Progress Notes (Signed)
Faculty Attending Note  Post Op Day 1  Subjective: Patient is feeling sore and light-headed, otherwise okay. She reports moderately well controlled pain on PO pain meds. She is not ambulating yet, had some light-headedness. Foley catheter in place. She is  passing flatus, has not had a BM yet. She is tolerating a clear liquid diet without nausea/vomiting. Bleeding is moderate. She is breast & bottle feeding. Babies are in NICU and doing well.  Objective: Blood pressure 112/72, pulse (!) 59, temperature 98.4 F (36.9 C), temperature source Oral, resp. rate 16, height 5\' 1"  (1.549 m), last menstrual period 01/29/2018, SpO2 100 %, unknown if currently breastfeeding. Temp:  [97.8 F (36.6 C)-98.5 F (36.9 C)] 98.4 F (36.9 C) (05/18 0302) Pulse Rate:  [56-75] 59 (05/18 0302) Resp:  [11-24] 16 (05/18 0639) BP: (112-140)/(71-87) 112/72 (05/18 0302) SpO2:  [95 %-100 %] 100 % (05/18 0302)  Physical Exam:  General: alert, oriented, cooperative Chest: normal respiratory effort Heart: RRR  Abdomen: soft, appropriately tender to palpation, incision covered by dressing with no evidence of active bleeding  Uterine Fundus: firm, 2 fingers below the umbilicus Lochia: moderate, rubra DVT Evaluation: no evidence of DVT Extremities: no edema, no calf tenderness  UOP: >75 mL/hr clear yellow urine  Recent Labs    08/17/18 2229 08/18/18 0537  HGB 9.8* 9.1*  HCT 28.8* 27.2*    Assessment/Plan: Patient Active Problem List   Diagnosis Date Noted  . HELLP (hemolytic anemia/elev liver enzymes/low platelets in pregnancy) 08/17/2018  . Severe preeclampsia 08/17/2018  . Cesarean delivery delivered 08/17/2018  . Pregnancy, twin, antepartum 06/04/2018  . 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 Trichomonal vaginitis during pregnancy, antepartum 05/04/2018  . Antepartum multigravida of advanced maternal age 67/28/2020  . Uterine scar from previous surgery affecting pregnancy 04/29/2018  .  Uterine fibroids affecting pregnancy 04/29/2018  . Supervision of normal pregnancy, antepartum 04/28/2018  . 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 [id number] POD#1 s/p 1LTCS at [redacted]w[redacted]d for HELLP, mono-di twins with fetal growth restriction, absent end diastolic flow. Course complicated by significantly elevated LFTs which are down-trending and low platelets, which are stable. She is s/p 24 hrs MgSO4. She is doing well, recovering appropriately and complains only of pain and some light-headedness. Encouraged ambulation today.   Dc foley S/p mag Continue routine post partum care Pain meds prn Regular diet Start lovenox once am labs are back nexplanon vs IUD for birth control   K. Arvilla Meres, M.D. Attending Center for [employer] (Faculty Practice)  08/18/2018, 7:23 AM

## 2018-08-18 NOTE — Anesthesia Postprocedure Evaluation (Signed)
Anesthesia Post Note  Patient: Fradel Baldonado  Procedure(s) Performed: CESAREAN SECTION (N/A )     Patient location during evaluation: PACU Anesthesia Type: Spinal Level of consciousness: oriented and awake and alert Pain management: pain level controlled Vital Signs Assessment: post-procedure vital signs reviewed and stable Respiratory status: spontaneous breathing, respiratory function stable and patient connected to nasal cannula oxygen Cardiovascular status: blood pressure returned to baseline and stable Postop Assessment: no headache, no backache and no apparent nausea or vomiting Anesthetic complications: no    Last Vitals:  Vitals:   08/18/18 0734 08/18/18 0735  BP: 134/77   Pulse: (!) 58   Resp: 17   Temp: 36.9 C   SpO2: 100% 98%    Last Pain:  Vitals:   08/18/18 0759  TempSrc:   PainSc: 4                  Kimmy Parish

## 2018-08-18 NOTE — Lactation Note (Signed)
This note was copied from a baby's chart. Lactation Consultation Note  Patient Name: Dawn Mayo [id number] Date: 08/18/2018 Reason for consult: Follow-up assessment   LC Follow Up Visit:  Mother not in her room; will return later today for a follow up visit.                  [name] 08/18/2018, 10:51 AM

## 2018-08-19 NOTE — Progress Notes (Signed)
CLINICAL SOCIAL WORK MATERNAL/CHILD NOTE  Patient Details  Name: June Rode MRN: [id number] Date of Birth: 03/14/1986  Date:  08/18/2018  Clinical Social Worker Initiating Note:  Abundio Miu, [place]     Date/Time: Initiated:  08/18/18/1105             Child's Name:  Ned Clines ;; Rosalyn Gess   Biological Parents:  Mother, Father(Father: Gidget Quizhpi)   Need for Interpreter:  None   Reason for Referral:  Parental Support of Premature Babies < 32 weeks/or Critically Ill babies   Address:  [place]  [address]    Phone number:  [phone] (home)     Additional phone number:   Household Members/Support Persons (HM/SP):   Household Member/Support Person 1   HM/SP Name Relationship DOB or Age  HM/SP -1 Maciah Feeback FOB/Husband   HM/SP -2     HM/SP -3     HM/SP -4     HM/SP -5     HM/SP -6     HM/SP -7     HM/SP -8       Natural Supports (not living in the home): Parent, Friends   Professional Supports:None   Employment:Unemployed, Self-employed(Currently unemployed due to [place] 19)   Type of Work: Emergency planning/management officer    Education:  Production designer, theatre/television/film   Homebound arranged:    Financial Resources:Medicaid   Other Resources: [employer]   Cultural/Religious Considerations Which May Impact Care:   Strengths: Ability to meet basic needs , Understanding of illness   Psychotropic Medications:         Pediatrician:       Pediatrician List:   [facility]     Pediatrician Fax Number:    Risk Factors/Current Problems: None   Cognitive State: Alert , Able to Concentrate , Goal Oriented , Insightful , Linear Thinking    Mood/Affect: Relaxed , Interested , Calm    CSW Assessment:CSW met with MOB and FOB at bedside to discuss infants NICU admission. CSW introduced self and explained reason for the consult.  MOB and FOB were welcoming and engaged during assessment. MOB reported that she resides with her husband is currently unemployed due to [place] 19. MOB reported that she started some shopping for the infants and may need some assistance obtaining items for the infants. MOB requested assistance with getting pack and plays for the infants. CSW agreed to notify [employer]. CSW inquired about MOB's support system, MOB reported that her parents in [place] and friends locally are her supports.   CSW spoke with MOB and FOB regarding infant's NICU admission. CSW inquired about how NICU admission was going, MOB reported that FOB has visited the NICU more and posed the question to FOB. FOB reported that the NICU admission has been good and he has been kept updated on infants care. FOB reported that there has been good communication from the NICU staff. CSW informed MOB/FOB about the NICU, what to expect and resources/supports available while infant is admitted to the NICU. MOB inquired about visitation for infants, CSW agreed to follow up [place] staff and get back to MOB's questions about both parents visiting twins at the same time. MOB/FOB denied any other questions/concerns.  CSW informed MOB/FOB about SSI, MOB verbalized plan to apply. CSW provided MOB with the butterflies from [employer].   CSW asked FOB to leave  so CSW could speak with MOB alone with MOB's permission, FOB left voluntarily. CSW inquired about MOB's mental health history, MOB denied any mental health history. MOB presented calm and did not demonstrate any acute mental health signs/symptoms. CSW assessed for safety, MOB denied SI, HI and domestic violence.   CSW provided education regarding the baby blues period vs. perinatal mood disorders, discussed treatment and gave resources for mental health follow up if concerns arise.  CSW recommends self-evaluation during the postpartum time period using the New Mom Checklist  from Postpartum Progress and encouraged MOB to contact a medical professional if symptoms are noted at any time.    CSW informed MOB about the hospital drug policy due to limited prenatal care (3 prenatal visits per chart review). MOB questioned policy and emphasized that she was seen at the MFM clinic weekly in addition to her prenatal care visits. CSW acknowledged MOB's visits and also that MOB birthed infants early. CSW explained policy at length and that infants would not have any additional procedures to complete CDS.  MOB denied any other questions related to hospital drug policy. MOB denied any substance use history.   CSW will continue to offer resources/supports while infant is admitted to the NICU.   CSW Plan/Description: Perinatal Mood and Anxiety Disorder (PMADs) Education, Theatre stage manager Income (SSI) Information, Other Patient/Family Education, [place], CSW Will Continue to Monitor Umbilical Cord Tissue Drug Screen Results and Make Report if Barbette Or, LCSW 08/19/2018, 10:11 AM     Revision History

## 2018-08-19 NOTE — Progress Notes (Signed)
Subjective: Postpartum Day 2: Cesarean Delivery Patient reports incisional pain, tolerating PO, + flatus and no problems voiding.  Temp to 100.5 last pm.  Objective: Vital signs in last 24 hours: Temp:  [98.6 F (37 C)-100.5 F (38.1 C)] 98.8 F (37.1 C) (05/19 0817) Pulse Rate:  [75-100] 90 (05/19 0817) Resp:  [16-18] 18 (05/19 0817) BP: (119-133)/(73-86) 122/81 (05/19 0817) SpO2:  [97 %-100 %] 99 % (05/19 0817)  Physical Exam:  General: alert, cooperative and appears stated age Lochia: appropriate Uterine Fundus: firm Incision: healing well, no significant drainage, no significant erythema DVT Evaluation: No evidence of DVT seen on physical exam.  Recent Labs    08/18/18 1002 08/18/18 1602  HGB 9.9* 9.6*  HCT 29.0* 28.2*    Assessment/Plan: Status post Cesarean section. Doing well postoperatively.  Continue current care. Likely ready for d/c except for fever last pm. Will hold d/c until 24 hour afebrile. Wants IUD pp Babies ok in NICU, transferring to twin room today   Dawn Mayo 08/19/2018, 11:15 AM

## 2018-08-20 MED ORDER — OXYCODONE-ACETAMINOPHEN 5-325 MG PO TABS: 1.0000 | ORAL_TABLET | Freq: Four times a day (QID) | ORAL | 0 refills | Status: DC | PRN

## 2018-08-20 MED ORDER — ENALAPRIL MALEATE 5 MG PO TABS
5.0000 mg | ORAL_TABLET | Freq: Every day | ORAL | Status: DC
  Administered 2018-08-20: 5 mg via ORAL
  Filled 2018-08-20: qty 1

## 2018-08-20 MED ORDER — ENALAPRIL MALEATE 5 MG PO TABS: 5.0000 mg | ORAL_TABLET | Freq: Every day | ORAL | 2 refills | Status: DC

## 2018-08-20 NOTE — Progress Notes (Signed)
Pt to go to nicu to be with twins husband to be with pt  Also teaching complete

## 2018-08-20 NOTE — Lactation Note (Signed)
This note was copied from a baby's chart. Lactation Consultation Note  Patient Name: Dawn Mayo ZTIWP'Y Date: 08/20/2018 Reason for consult: Follow-up assessment;NICU baby;Preterm <34wks;Infant < 6lbs;Primapara;1st time breastfeeding  P2 mother whose infant twins are 28+4 weeks and in the NICU.  Until last night mother had been pumping every three hours.  She stated she did not keep that up last night because she was so tired but is planning to start back up with that routine today.  Encouraged her to continue pumping every three hours.  She is familiar with hand expression, has colostrum containers and bottles and has a "hands free" bra.  Discussed milk transport to hospital, STS while visiting with babies and offered mother our assistance when the babies are ready to be put to the breast for latching.  Suggested she bring her pump parts to the hospital and pump and babies' bedside.    Engorgement prevention/treatment discussed.  Manual pump provided with instructions for use.  Mother feels like her breasts are getting fuller this a.m.  After assessment, I noticed they are still soft and non tender and nipples are everted and intact.  Discussed milk "coming to volume" and continued hand expression.  #24 flange size with pump is appropriate and mother denies pain with pumping.  [facility] referral faxed by me at first visit.  Mother has already called [facility] and will pick up her DEBP on the way home at discharge.  She has our OP phone number to call for questions/concerns after discharge.  Father present.     Maternal Data Formula Feeding for Exclusion: Yes Reason for exclusion: Mother's choice to formula and breast feed on admission Has patient been taught Hand Expression?: Yes Does the patient have breastfeeding experience prior to this delivery?: No  Feeding    LATCH Score                   Interventions    Lactation Tools Discussed/Used WIC Program: Yes   Consult  Status Consult Status: Complete Date: 08/20/18 Follow-up type: Call as needed    Dawn Mayo 08/20/2018, 8:07 AM

## 2018-08-21 ENCOUNTER — Other Ambulatory Visit (HOSPITAL_COMMUNITY): Payer: Medicaid Other

## 2018-08-21 ENCOUNTER — Ambulatory Visit (HOSPITAL_COMMUNITY): Payer: Medicaid Other

## 2018-08-26 ENCOUNTER — Ambulatory Visit (HOSPITAL_COMMUNITY): Payer: Medicaid Other

## 2018-08-26 ENCOUNTER — Ambulatory Visit: Payer: Medicaid Other

## 2018-08-26 ENCOUNTER — Other Ambulatory Visit (HOSPITAL_COMMUNITY): Payer: Medicaid Other

## 2018-08-26 DIAGNOSIS — Z0130 Encounter for examination of blood pressure without abnormal findings: Secondary | ICD-10-CM

## 2018-08-26 NOTE — Progress Notes (Signed)
Pt states that she is at the hospital with babies will call to give BP later.    Called pt x 2 this afternoon. Left vm for pt to call the office back.

## 2018-08-27 ENCOUNTER — Telehealth: Payer: Self-pay

## 2018-08-27 NOTE — Telephone Encounter (Signed)
Patient called and states that her blood pressure reading today is 111/80. Assured her it was good. Kathrene Alu RN

## 2018-08-28 ENCOUNTER — Other Ambulatory Visit (HOSPITAL_COMMUNITY): Payer: Medicaid Other

## 2018-08-28 ENCOUNTER — Ambulatory Visit (HOSPITAL_COMMUNITY): Payer: Medicaid Other

## 2018-09-01 ENCOUNTER — Ambulatory Visit: Payer: Medicaid Other | Admitting: Obstetrics & Gynecology

## 2018-09-01 ENCOUNTER — Encounter: Payer: Medicaid Other | Admitting: Obstetrics & Gynecology

## 2018-09-02 ENCOUNTER — Telehealth: Payer: Self-pay | Admitting: Obstetrics and Gynecology

## 2018-09-02 NOTE — Telephone Encounter (Signed)
Called patient, discussed her delivery and condition of her newborns. Mom in NICU with babies who are doing well and stable. 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

## 2018-09-04 ENCOUNTER — Other Ambulatory Visit: Payer: Self-pay

## 2018-09-04 ENCOUNTER — Encounter: Payer: Self-pay | Admitting: Family Medicine

## 2018-09-04 ENCOUNTER — Ambulatory Visit (INDEPENDENT_AMBULATORY_CARE_PROVIDER_SITE_OTHER): Payer: Medicaid Other | Admitting: Family Medicine

## 2018-09-04 VITALS — BP 117/80 | HR 102 | Ht 61.0 in | Wt 126.0 lb

## 2018-09-04 DIAGNOSIS — Z5189 Encounter for other specified aftercare: Secondary | ICD-10-CM

## 2018-09-04 NOTE — Progress Notes (Signed)
   Subjective:    Patient ID: Dawn Mayo, female    DOB: 02-20-1986, 33 y.o.   MRN: [id number]  HPI  Pt 2 weeks postpartum from RLTCS. No issues with incision. Continues to have some pain.  Review of Systems     Objective:   Physical Exam Pulmonary:     Effort: Pulmonary effort is normal.  Abdominal:     General: Abdomen is flat.     Palpations: Abdomen is soft.     Comments: Incision clean, dry, intact. No erythema or drainage.  Neurological:     Mental Status: She is alert.       Assessment & Plan:  1. Encounter for wound re-check Stable. F/u in 2 weeks for PP visit.

## 2018-09-08 MED ORDER — IBUPROFEN 800 MG PO TABS: 800.0000 mg | ORAL_TABLET | Freq: Three times a day (TID) | ORAL | 3 refills | Status: DC | PRN

## 2018-09-14 ENCOUNTER — Ambulatory Visit: Payer: Self-pay

## 2018-09-14 NOTE — Lactation Note (Signed)
This note was copied from a baby's chart. Lactation Consultation Note  Patient Name: Girl A Charlize Hathaway Today's Date: 09/14/2018 Reason for consult: Mother's request;Follow-up assessment;NICU baby(RN request)  1600 - 1640 - RN requested LC support to help primip mom of twins (now 4 weeks - 74 weeks old now) with troubleshooting low milk supply. Mom has made a detailed spreadsheet pumping log and has been pumping 5-7 times a day for 30 minutes using a symphony pump. She yields 15-30 mls of milk combined. She has no medical history and positive breast changes.  After discussion of her pumping log and analysis, we made some adjustments:  1. Switch from 21 to 27 flange size 2. Switch from initiation to expression setting on breast pump 3. Adjust pressure 4. Pump at least 8 times a day and pump on a set schedule if possible 5. Continue with power pumping 1-2 times a day for four days 6. Try a new herb (fenugreek seemed to decrease supply). Ms. Reade is going to investigate the Legendairy herbs and Mother Love Mallungay and we discussed supplementation guidelines and dosage 7. Dad asked how to support mom, and I suggested being a moral support (listening to her needs), helping take something off her plate to manage stress and to have a date/fun day or evening  Feeding Feeding Type: Donor Breast Milk   Lactation Tools Discussed/Used Pump Review: Setup, frequency, and cleaning Initiated by:: hl Date initiated:: 09/14/18   Consult Status Consult Status: Follow-up Date: 09/21/18 Follow-up type: In-patient    Lenore Manner 09/14/2018, 7:35 PM

## 2018-09-21 ENCOUNTER — Ambulatory Visit: Payer: Self-pay

## 2018-09-21 NOTE — Lactation Note (Signed)
This note was copied from a baby's chart. Lactation Consultation Note  Patient Name: Dawn Mayo Today's Date: 09/21/2018 Reason for consult: Follow-up assessment;Mother's request;1st time breastfeeding;NICU baby;Preterm <34wks   2105 - 2135 - I followed up with Dawn Mayo upon mother's request. She shared her pumping log with me for the last week. Here are the main points:  1. She is now pumping 8 times a day. She has a schedule that she tries to maintain. She sometimes misses the 4-5 am pump or has a delay because she has difficulty waking up. She makes up for this by pumping more often during the day. She is trying to stick to a schedule that calls for pumping approximately every three hours. She has continued with the power hour pumps.  2. Her daily milk volume has increased by approximately 50 mls in the past week since our last visit on 6/14.  3. She is using size 27 flanges and using a symphony pump at home. She is still experiencing nipple sensitivity, and she states that this only began since she delivered and began pumping. They are less sensitive after some time has passed following a pump session.  4. She has obtained brewers yeast and some Motherlove More Milk Plus. Upon reading the ingredients, this is a fenugreek-based supplement. Mom may not respond to fenugreek as mentioned in my note on 6/14.  Plan:  1. After studying her spreadsheet, we determined which times were most productive for milk volume. We are going to adjust the 4-5 am pump up to 2-3 am to see if this is a better pump time for milk volume and to see if Dawn Mayo has an easier time of waking up. She has had the most difficulty staying consistent with her 4-5 am pump. Her best pump volumes are at 8 am and 11 pm. She sleeps between midnight and 7-8. She is a deep sleeper and a night owl.   2. Dawn Mayo will make a personal decision about continuing the More Milk Plus. She is thinking that she will continue  for several more days and then see what happens when she stops. She is going to up her dosage of brewers yeast and make some lactation cookies.  3. Dawn Mayo has been counseled about taking a Mallungay supplement. We do not want her to continue to incur expenses, but she may find this supplement to be helpful.  4. I recommended that she follow up with another [place] for a second opinion about her sore nipples later this week. In the meantime, we discussed pumping pressure, and I gave her some nipple shells. Her nipples appeared in tact, and I did not note discoloration (though the room was dim).  The [place] babies are growing. Dawn Mayo is very supportive and plans to do another date day this week.  Maternal Data Does the patient have breastfeeding experience prior to this delivery?: No  Feeding Feeding Type: Breast Milk   Interventions Interventions: (pumping education)  Lactation Tools Discussed/Used Breast pump type: Double-Electric Breast Pump Pump Review: Setup, frequency, and cleaning   Consult Status Consult Status: Follow-up Date: 09/25/18 Follow-up type: Call as needed    Dawn Mayo 09/21/2018, 9:55 PM

## 2018-09-25 ENCOUNTER — Ambulatory Visit: Payer: Medicaid Other | Admitting: Family Medicine

## 2018-09-29 ENCOUNTER — Ambulatory Visit: Payer: Self-pay

## 2018-09-29 NOTE — Lactation Note (Signed)
This note was copied from a baby's chart. Lactation Consultation Note;  Patient Name: Dawn Mayo Today's Date: 09/29/2018 Reason for consult: Follow-up assessment;NICU baby;Preterm <34wks;Multiple gestation;1st time breastfeeding;Mother's request   [facility] arrived in Twins,  room. Mother was pumping. Mother reports that she pumps for 30 mins . She reports that she feels that her nipple is just very elastic.  She is continuing to use the #27 flange. Her entire nipple and part of the areola are filling the tunnel of the flange. She reports that she feels slight tenderness on second phase then  she dials the pump down for comfort. Mother pumped 43 ml at this session.    Mother reports that she is continuing to keep a pumping log. She reports that she is pumping between 180-220 ml in a 24 hours period. Mother is also power pumping.  Mother is taking Brewers Personnel officer from [place] source.  Mother reports that fenugreek decreases her milk supply. She has tried several different brands. Lots of discussion about mothers routine. She spends the night at the hospital often and she reports that last night only getting 2-3 hours of sleep. She very seldom  takes a nap during the day. Mother reports that she has increased her fld intake and she can tell some increase in her milk supply.   Supported mother and praised her for all her efforts of consistent pumping for 6 weeks.  Mother has researched lots of different brands of galactagoges. Mother reports that she is unsure what is working for her.   Discussed the use of Reglan. Mother reports that she has read about Domperidone and Reglan. She reports that she read about some of the side effects of Domperidone.   Mother has a visit with OB DR on Thursday. Suggested to have a discussion about Reglan with her OB . Mother denies having any history of depression. Informed mother that if taking Reglan she would need observation of OB or Primary Care  MD for S/S of depression.   Baby A "Dawn Mayo" was placed in football hold with Mother STS. Mother advised in proper STS to keep infant warm and observed more alert state. Infant shows some rooting cues. She opened wide and took entire nipple in her mouth. She was observed with 2-3 sucks and then paused with nipple in her mouth for an extended. Mother reports that she has latched like this before with only a few sucks. Infant suckles strong on her pacifier. Infant in mothers arms approx  20 mins.   Baby B, "Dawn Mayo" was somewhat more eager with observed alertness and cues. When she was placed STS with mother only licking milk off of mothers nipple. Mother hand expressed milk several times and Parker responded with licking. Infant remained STS with mother for about 20 mins.   Suggested that mother continue to follow up with [facility] services for consults. Mother to phone office for questions or concerns at any time.  Suggested to follow up again with reports of milk supply and assistance with STS as needed.  Discussed the use of a Nipple Shield on the next follow up visit. Mother receptive to all teaching. Father at the bedside and receptive as well.  Mother to be seen on PRN basis.    Maternal Data    Feeding Feeding Type: Breast Fed  LATCH Score Latch: Repeated attempts needed to sustain latch, nipple held in mouth throughout feeding, stimulation needed to elicit sucking reflex.  Audible Swallowing: None  Type of  Nipple: Everted at rest and after stimulation  Comfort (Breast/Nipple): Soft / non-tender  Hold (Positioning): Full assist, staff holds infant at breast  LATCH Score: 5  Interventions Interventions: Skin to skin;Hand express;Adjust position;Support pillows  Lactation Tools Discussed/Used     Consult Status Consult Status: Follow-up    Jess Barters [facility] 09/29/2018, 3:38 PM

## 2018-10-02 ENCOUNTER — Other Ambulatory Visit: Payer: Self-pay

## 2018-10-02 ENCOUNTER — Ambulatory Visit (INDEPENDENT_AMBULATORY_CARE_PROVIDER_SITE_OTHER): Payer: Medicaid Other | Admitting: Family Medicine

## 2018-10-02 ENCOUNTER — Encounter: Payer: Self-pay | Admitting: Family Medicine

## 2018-10-02 VITALS — BP 140/77 | HR 76 | Wt 135.0 lb

## 2018-10-02 DIAGNOSIS — Z1389 Encounter for screening for other disorder: Secondary | ICD-10-CM

## 2018-10-02 DIAGNOSIS — Z01812 Encounter for preprocedural laboratory examination: Secondary | ICD-10-CM

## 2018-10-02 DIAGNOSIS — Z30430 Encounter for insertion of intrauterine contraceptive device: Secondary | ICD-10-CM | POA: Diagnosis not present

## 2018-10-02 DIAGNOSIS — Z3202 Encounter for pregnancy test, result negative: Secondary | ICD-10-CM | POA: Diagnosis not present

## 2018-10-02 LAB — POCT URINE PREGNANCY: Preg Test, Ur: NEGATIVE

## 2018-10-02 MED ORDER — LEVONORGESTREL 19.5 MCG/DAY IU IUD
INTRAUTERINE_SYSTEM | Freq: Once | INTRAUTERINE | Status: AC
  Administered 2018-10-02: 1 via INTRAUTERINE

## 2018-10-02 MED ORDER — METOCLOPRAMIDE HCL 10 MG PO TABS: 10.0000 mg | ORAL_TABLET | Freq: Three times a day (TID) | ORAL | 3 refills | Status: DC

## 2018-10-02 NOTE — Progress Notes (Signed)
Post Partum Exam  Dawn Mayo is a 33 y.o. [id number] female who presents for a postpartum visit. She is 6 weeks postpartum following a low cervical transverse Cesarean section. I have fully reviewed the prenatal and intrapartum course. The delivery was at 28 gestational weeks.  Anesthesia: epidural. Postpartum course has been complicated by HELLP. Baby's course has been affecting by preterm delivery and in NICU. Baby is feeding by breastmilk via NG tube. Bleeding moderate lochia. Bowel function is normal. Bladder function is normal. Patient is not sexually active. Contraception method is IUD. Postpartum depression screening:neg  The following portions of the patient's history were reviewed and updated as appropriate: allergies, current medications, past family history, past medical history, past social history, past surgical history and problem list. Last pap smear done 04/2018 and was Abnormal- needs repeat in Jan 2021.   Review of Systems Pertinent items are noted in HPI.    Objective:  unknown if currently breastfeeding.  BP 140/77   Pulse 76   Wt 135 lb (61.2 kg)   LMP 09/24/2018 (Exact Date)   Breastfeeding Yes   BMI 25.51 kg/m   General:  alert, cooperative and no distress  Lungs: clear to auscultation bilaterally  Heart:  regular rate and rhythm, S1, S2 normal, no murmur, click, rub or gallop  Abdomen: soft, non-tender; bowel sounds normal; no masses,  no organomegaly   Vulva:  normal  Vagina: normal vagina, no discharge, exudate, lesion, or erythema  Cervix:  multiparous appearance, no cervical motion tenderness and no lesions        IUD Procedure Note Patient identified, informed consent performed, signed copy in chart, time out was performed.  Urine pregnancy test negative.  Speculum placed in the vagina.  Cervix visualized.  Cleaned with Betadine x 2.  Grasped anteriorly with a single tooth tenaculum.  Uterus sounded to 9 cm.  Liletta  IUD placed per manufacturer's  recommendations.  Strings trimmed to 3 cm. Tenaculum was removed, good hemostasis noted.  Patient tolerated procedure well.   Patient given post procedure instructions and Liletta care card with expiration date.  Patient is asked to check IUD strings periodically and follow up in 4-6 weeks for IUD check.    Assessment:    Normal postpartum exam. Pap smear not done at today's visit.   Plan:   1. Contraception: IUD 2. Patient having difficulty with milk supply. Taking herbal supplements. Would like to start medication to help. Discussed domperidone vs reglan - will start with reglan. 3. Follow up in: 1 month or as needed.

## 2018-11-03 ENCOUNTER — Ambulatory Visit: Payer: Medicaid Other | Admitting: Obstetrics & Gynecology

## 2018-11-17 ENCOUNTER — Other Ambulatory Visit: Payer: Self-pay

## 2018-11-17 ENCOUNTER — Ambulatory Visit (INDEPENDENT_AMBULATORY_CARE_PROVIDER_SITE_OTHER): Payer: Medicaid Other | Admitting: Family Medicine

## 2018-11-17 ENCOUNTER — Ambulatory Visit: Payer: Medicaid Other | Admitting: Family Medicine

## 2018-11-17 ENCOUNTER — Encounter: Payer: Self-pay | Admitting: Family Medicine

## 2018-11-17 VITALS — BP 113/74 | HR 68 | Ht 60.0 in | Wt 142.1 lb

## 2018-11-17 DIAGNOSIS — Z8759 Personal history of other complications of pregnancy, childbirth and the puerperium: Secondary | ICD-10-CM

## 2018-11-17 DIAGNOSIS — Z30431 Encounter for routine checking of intrauterine contraceptive device: Secondary | ICD-10-CM | POA: Diagnosis not present

## 2018-11-17 MED ORDER — MEDROXYPROGESTERONE ACETATE 10 MG PO TABS: 10.0000 mg | ORAL_TABLET | Freq: Every day | ORAL | 0 refills | Status: DC

## 2018-11-17 NOTE — Progress Notes (Signed)
   Subjective:   Patient Name: Dawn Mayo, female   DOB: Aug 02, 1985, 33 y.o.  MRN: [id number]  HPI Patient here for an IUD check.  She had the [place] IUD placed 1 month ago.  She reports bleeding almost every day since insertion 6 weeks ago.   Review of Systems  Constitutional: Negative for fever and chills.  Gastrointestinal: Negative for abdominal pain.  Genitourinary: Negative for vaginal discharge, vaginal pain, pelvic pain and dyspareunia.        Objective:   Physical Exam  Constitutional: She appears well-developed and well-nourished.  HENT:  Head: Normocephalic and atraumatic.  Abdominal: Soft. There is no tenderness. There is no guarding.  Genitourinary: There is no rash, tenderness or lesion on the right labia. There is no rash, tenderness or lesion on the left labia. No erythema or tenderness in the vagina. No foreign body around the vagina. No signs of injury around the vagina. No vaginal discharge found.    Skin: Skin is warm and dry.  Psychiatric: She has a normal mood and affect. Her behavior is normal. Judgment and thought content normal.       Assessment & Plan:  1. IUD check up IUD in place.  Provera 10mg  x 30 days, then stop. Pt to call with any other problems.  Recheck in 1 year.  2. H/o HELLP syndrome Check CMP and CBC for normalization.

## 2018-11-18 ENCOUNTER — Telehealth: Payer: Self-pay

## 2018-11-18 NOTE — Telephone Encounter (Signed)
Patient forgot to go to lab after visit yesterday. Called to make her aware to come back in for lab visit but mailbox full. Will send my chart message

## 2018-11-18 NOTE — Telephone Encounter (Signed)
Patient made appointment.

## 2018-11-18 NOTE — Telephone Encounter (Signed)
-----   Message from Truett Mainland, DO sent at 11/17/2018 11:52 AM EDT ----- Patient forgot to get lab slip

## 2018-11-24 ENCOUNTER — Other Ambulatory Visit: Payer: Self-pay

## 2018-11-24 ENCOUNTER — Ambulatory Visit: Payer: Medicaid Other

## 2018-11-24 DIAGNOSIS — Z09 Encounter for follow-up examination after completed treatment for conditions other than malignant neoplasm: Secondary | ICD-10-CM

## 2018-11-24 NOTE — Progress Notes (Signed)
Pt was given lab slip and sent to the lab to have CMP and CBC labs drawn. chiquita l wilson, CMA

## 2018-11-25 LAB — CBC
Hematocrit: 35.8 % (ref 34.0–46.6)
Hemoglobin: 11.5 g/dL (ref 11.1–15.9)
MCH: 27.7 pg (ref 26.6–33.0)
MCHC: 32.1 g/dL (ref 31.5–35.7)
MCV: 86 fL (ref 79–97)
Platelets: 321 10*3/uL (ref 150–450)
RBC: 4.15 x10E6/uL (ref 3.77–5.28)
RDW: 13.1 % (ref 11.7–15.4)
WBC: 8.6 10*3/uL (ref 3.4–10.8)

## 2018-11-25 LAB — COMPREHENSIVE METABOLIC PANEL
ALT: 11 IU/L (ref 0–32)
AST: 14 IU/L (ref 0–40)
Albumin/Globulin Ratio: 1.3 (ref 1.2–2.2)
Albumin: 4.3 g/dL (ref 3.8–4.8)
Alkaline Phosphatase: 55 IU/L (ref 39–117)
BUN/Creatinine Ratio: 14 (ref 9–23)
BUN: 13 mg/dL (ref 6–20)
Bilirubin Total: <0.2 mg/dL (ref 0.0–1.2)
CO2: 25 mmol/L (ref 20–29)
Calcium: 9.5 mg/dL (ref 8.7–10.2)
Chloride: 103 mmol/L (ref 96–106)
Creatinine, Ser: 0.93 mg/dL (ref 0.57–1.00)
GFR calc Af Amer: 92 mL/min/{1.73_m2} (ref >59)
GFR calc non Af Amer: 80 mL/min/{1.73_m2} (ref >59)
Globulin, Total: 3.2 g/dL (ref 1.5–4.5)
Glucose: 84 mg/dL (ref 65–99)
Potassium: 4.4 mmol/L (ref 3.5–5.2)
Sodium: 140 mmol/L (ref 134–144)
Total Protein: 7.5 g/dL (ref 6.0–8.5)

## 2019-03-31 DIAGNOSIS — Z20828 Contact with and (suspected) exposure to other viral communicable diseases: Secondary | ICD-10-CM | POA: Diagnosis not present

## 2019-06-22 DIAGNOSIS — F320 Major depressive disorder, single episode, mild: Secondary | ICD-10-CM | POA: Diagnosis not present

## 2019-07-07 DIAGNOSIS — F320 Major depressive disorder, single episode, mild: Secondary | ICD-10-CM | POA: Diagnosis not present

## 2019-07-13 DIAGNOSIS — F320 Major depressive disorder, single episode, mild: Secondary | ICD-10-CM | POA: Diagnosis not present

## 2019-07-20 DIAGNOSIS — F320 Major depressive disorder, single episode, mild: Secondary | ICD-10-CM | POA: Diagnosis not present

## 2019-08-03 DIAGNOSIS — F320 Major depressive disorder, single episode, mild: Secondary | ICD-10-CM | POA: Diagnosis not present

## 2019-08-11 DIAGNOSIS — F320 Major depressive disorder, single episode, mild: Secondary | ICD-10-CM | POA: Diagnosis not present

## 2019-08-17 DIAGNOSIS — F320 Major depressive disorder, single episode, mild: Secondary | ICD-10-CM | POA: Diagnosis not present

## 2019-08-18 DIAGNOSIS — F320 Major depressive disorder, single episode, mild: Secondary | ICD-10-CM | POA: Diagnosis not present

## 2019-09-01 DIAGNOSIS — F320 Major depressive disorder, single episode, mild: Secondary | ICD-10-CM | POA: Diagnosis not present

## 2019-09-13 IMAGING — US US MFM OB FOLLOW UP ADDL GEST
1 series · 15 of 28 positions shown · non-contrast
Comparison: none

[Series 1: us mfm ob follow up addl gest · 15 of 65 slices shown]
[im 1/65]
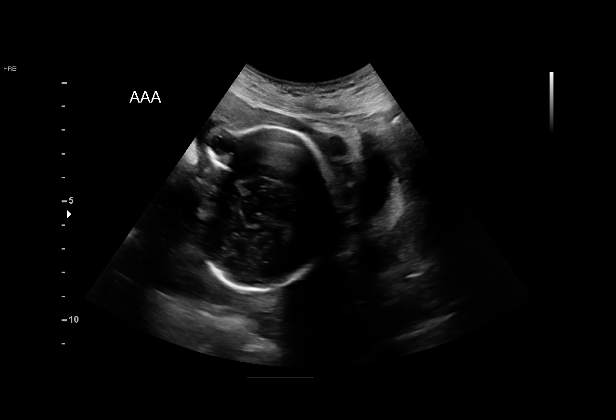
[im 5/65]
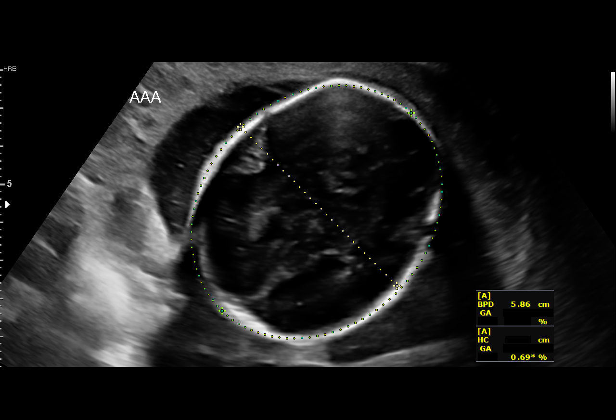
[im 10/65]
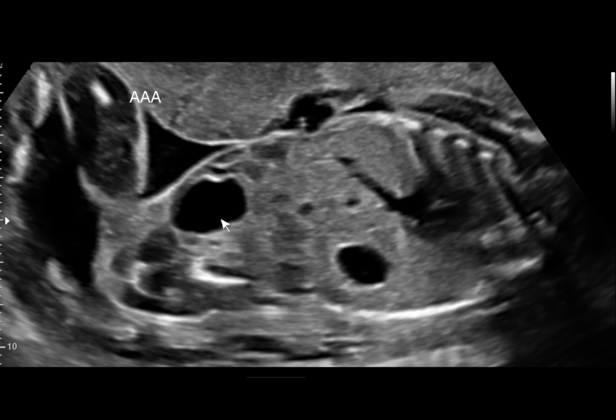
[im 15/65]
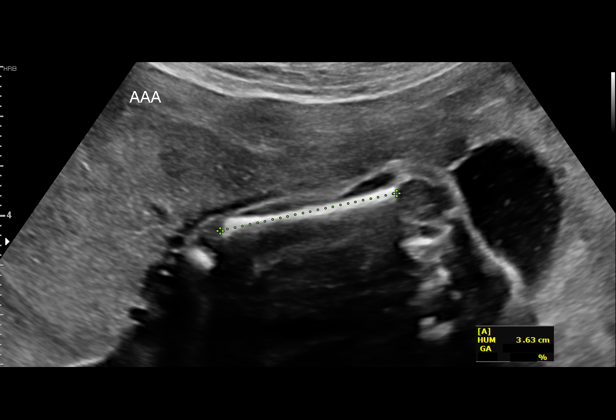
[im 19/65]
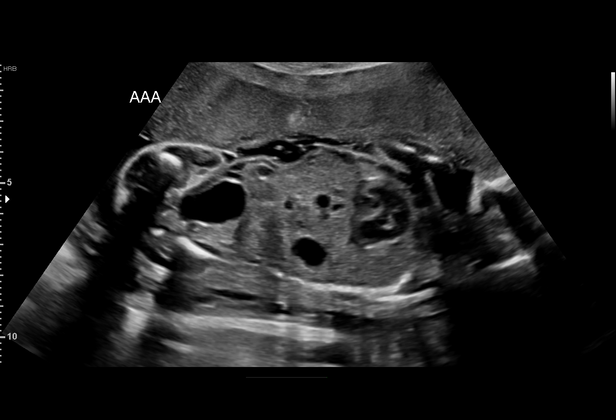
[im 24/65]
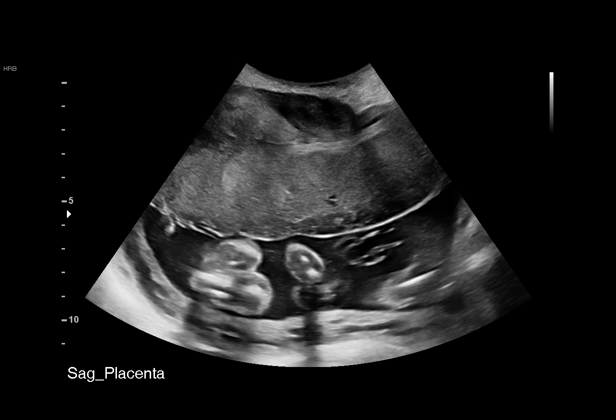
[im 29/65]
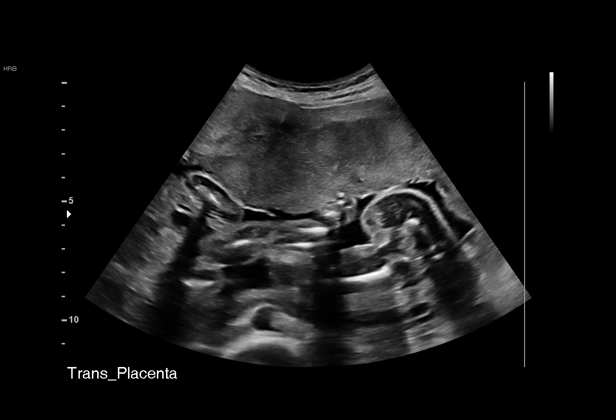
[im 34/65]
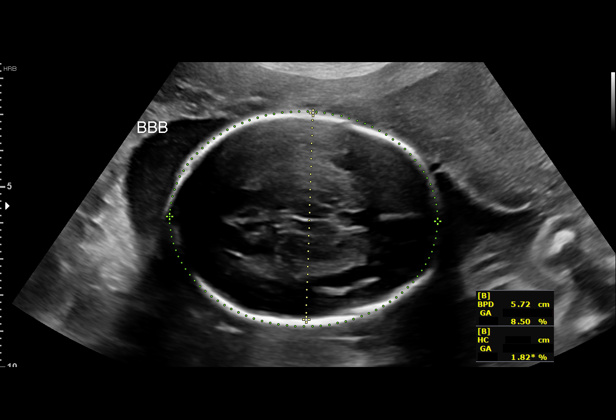
[im 36/65]
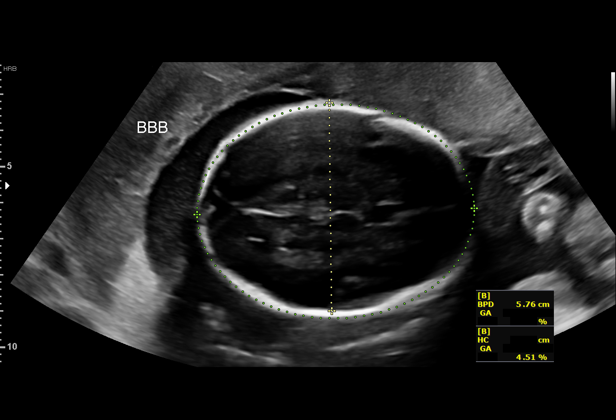
[im 41/65]
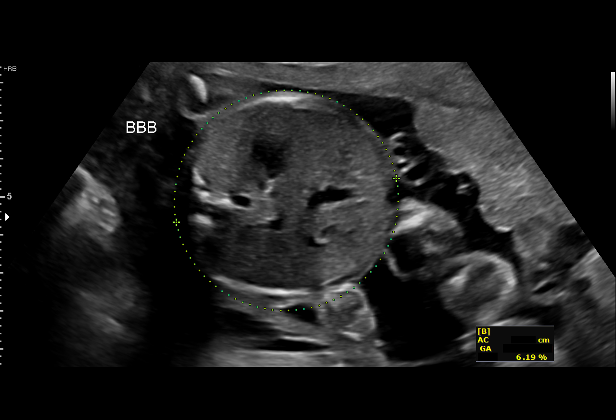
[im 46/65]
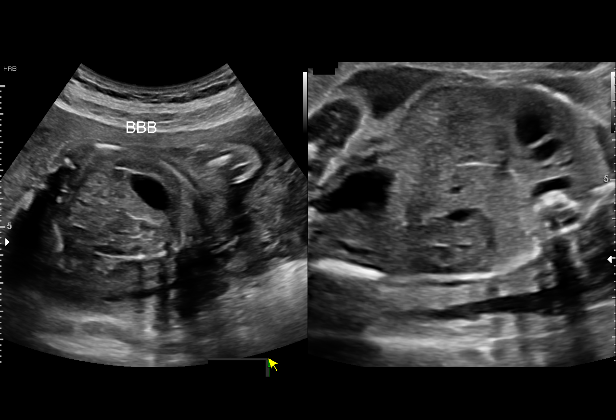
[im 50/65]
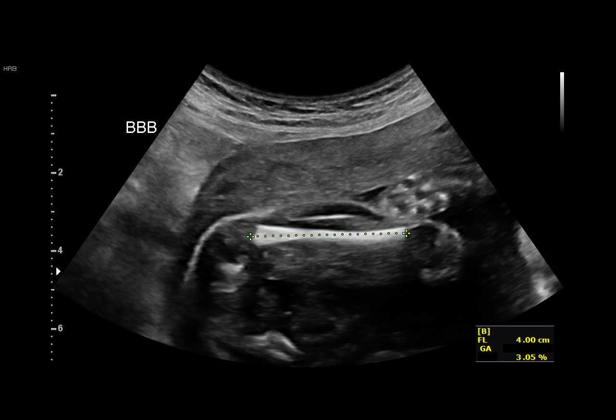
[im 55/65]
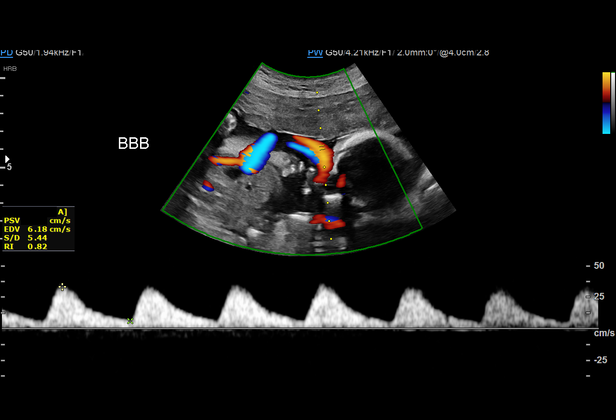
[im 60/65]
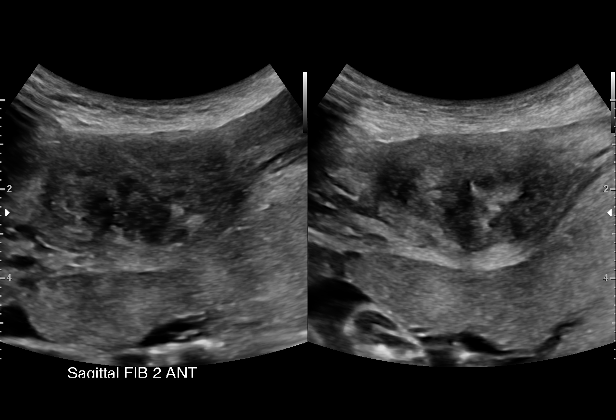
[im 65/65]
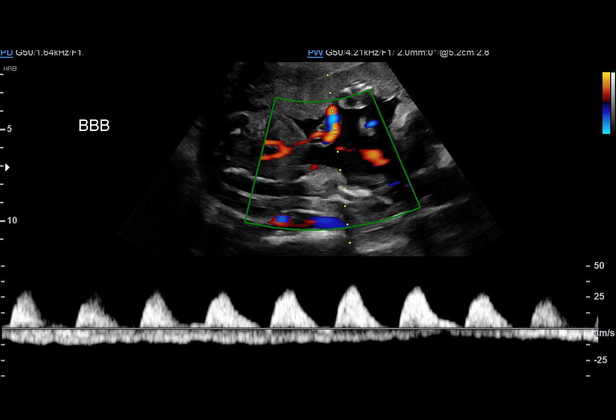

[15 of 28 positions shown; findings below may reference images not displayed]

KARYME

     OSULA
     OSULA RE EVAL
 ----------------------------------------------------------------------

 ----------------------------------------------------------------------
Indications

  Maternal care for known or suspected poor
  fetal growth, second trimester, fetus 1
  Maternal care for known or suspected poor
  fetal growth, second trimester, fetus 2
  Twin pregnancy, Laireiter/Boldt, second trimester
  Uterine fibroids affecting pregnancy in        O34.12,
  second trimester, antepartum
  Uterine scar, other than C/S (Myomectomy)
  24 weeks gestation of pregnancy
 ----------------------------------------------------------------------
Vital Signs

 BMI:
Fetal Evaluation (Fetus A)

 Num Of Fetuses:         2
 Fetal Heart Rate(bpm):  146
 Cardiac Activity:       Observed
 Fetal Lie:              Lower Fetus
 Presentation:           Cephalic
 Placenta:               Anterior
 P. Cord Insertion:      Previously Visualized
 Membrane Desc:      Dividing Membrane seen - Monochorionic

 Amniotic Fluid
 AFI FV:      Within normal limits

                             Largest Pocket(cm)

Biometry (Fetus A)

 BPD:      58.5  mm     G. Age:  24w 0d         17  %    CI:        78.94   %    70 - 86
                                                         FL/HC:      18.9   %    18.7 -
 HC:      208.2  mm     G. Age:  22w 6d        < 3  %    HC/AC:      1.22        1.04 -
 AC:      170.6  mm     G. Age:  22w 0d        < 3  %    FL/BPD:     67.2   %    71 - 87
 FL:       39.3  mm     G. Age:  22w 4d        < 3  %    FL/AC:      23.0   %    20 - 24
 HUM:      36.3  mm     G. Age:  22w 5d        < 5  %

 Est. FW:     504  gm      1 lb 2 oz     14  %     FW Discordancy         8  %
OB History

 Gravidity:    3         Term:   0        Prem:   0        SAB:   1
 TOP:          1       Ectopic:  0        Living: 0
Gestational Age (Fetus A)

 LMP:           24w 5d        Date:  01/29/18                 EDD:   11/05/18
 U/S Today:     22w 6d                                        EDD:   11/18/18
 Best:          24w 5d     Det. By:  LMP  (01/29/18)          EDD:   11/05/18
Anatomy (Fetus A)

 Cranium:               Appears normal         Aortic Arch:            Previously seen
 Cavum:                 Appears normal         Ductal Arch:            Previously seen
 Ventricles:            Appears normal         Diaphragm:              Appears normal
 Choroid Plexus:        Previously seen        Stomach:                Appears normal, left
                                                                       sided
 Cerebellum:            Previously seen        Abdomen:                Appears normal
 Posterior Fossa:       Previously seen        Abdominal Wall:         Previously seen
 Nuchal Fold:           Previously seen        Cord Vessels:           Previously seen
 Face:                  Orbits and profile     Kidneys:                Appear normal
                        previously seen
 Lips:                  Previously seen        Bladder:                Appears normal
 Thoracic:              Appears normal         Spine:                  Limited views
                                                                       previously seen
 Heart:                 Previously seen        Upper Extremities:      Previously seen
 RVOT:                  Previously seen        Lower Extremities:      Previously seen
 LVOT:                  Previously seen

 Other:  Fetus appears to be female.
Doppler - Fetal Vessels (Fetus A)

 Umbilical Artery
                                                           ADFV    RDFV
                                                             Yes      No

Fetal Evaluation (Fetus B)
 Num Of Fetuses:         2
 Fetal Heart Rate(bpm):  142
 Cardiac Activity:       Observed
 Fetal Lie:              Upper Fetus
 Presentation:           Cephalic
 Placenta:               Anterior
 P. Cord Insertion:      Previously Visualized
 Membrane Desc:      Dividing Membrane seen - Monochorionic

 Amniotic Fluid
 AFI FV:      Within normal limits

                             Largest Pocket(cm)

Biometry (Fetus B)

 BPD:      57.4  mm     G. Age:  23w 4d         10  %    CI:        73.46   %    70 - 86
                                                         FL/HC:      18.6   %    18.7 -
 HC:      212.8  mm     G. Age:  23w 3d        < 3  %    HC/AC:      1.18        1.04 -
 AC:      180.9  mm     G. Age:  22w 6d          5  %    FL/BPD:     69.0   %    71 - 87
 FL:       39.6  mm     G. Age:  22w 5d        < 3  %    FL/AC:      21.9   %    20 - 24
 HUM:        35  mm     G. Age:  22w 0d        < 5  %
 LV:        3.1  mm

 Est. FW:     548  gm      1 lb 3 oz     19  %     FW Discordancy      0 \ 8 %
Gestational Age (Fetus B)

 LMP:           24w 5d        Date:  01/29/18                 EDD:   11/05/18
 U/S Today:     23w 1d                                        EDD:   11/16/18
 Best:          24w 5d     Det. By:  LMP  (01/29/18)          EDD:   11/05/18
Anatomy (Fetus B)

 Cranium:               Appears normal         Aortic Arch:            Previously seen
 Cavum:                 Appears normal         Ductal Arch:            Previously seen
 Ventricles:            Appears normal         Diaphragm:              Appears normal
 Choroid Plexus:        Previously seen        Stomach:                Appears normal, left
                                                                       sided
 Cerebellum:            Previously seen        Abdomen:                Appears normal
 Posterior Fossa:       Previously seen        Abdominal Wall:         Previously seen
 Nuchal Fold:           Previously seen        Cord Vessels:           Previously seen
 Face:                  Orbits and profile     Kidneys:                Appear normal
                        previously seen
 Lips:                  Previously seen        Bladder:                Appears normal
 Thoracic:              Appears normal         Spine:                  Limited views
                                                                       previously seen
 Heart:                 Previously seen        Upper Extremities:      Previously seen
 RVOT:                  Previously seen        Lower Extremities:      Previously seen
 LVOT:                  Previously seen

 Other:  Fetus appears to be female.
Doppler - Fetal Vessels (Fetus B)

 Umbilical Artery
  S/D     %tile                                            ADFV    RDFV
 5.81    > 97.5                                              Yes      No
Cervix Uterus Adnexa

 Cervix
 Not visualized (advanced GA >20wks)
Impression

 Monochorionic-diamniotic twin pregnancy.
 Patient returned for fetal growth assessments.
 Twin A: Lower fetus, cephalic, anterior placenta. The
 estimated fetal weight is at the 14th percentile. Suboptimal
 growth is seen. Amniotic fluid is normal and good fetal activity
 is seen. Fetal bladder is seen. Umbilical artery Doppler was
 performed because of suboptimal growth. Doppler showed
 persistent absent end-diastolic flow.

 Twin B: Upper fetus, cephalic, anterior placenta. The
 estimated fetal weight is at the 19th percentile. Suboptimal
 growth is seen. Amniotic fluid is normal and good fetal activity
 is seen. Fetal bladder is seen. Umbilical artery Doppler was
 performed because of suboptimal growth. Doppler showed
 intermittent absent end-diastolic flow.

 I explained the significance of fetal weights and abnormal
 Doppler studies consistent with fetal growth restriction. Most-
 likely cause being placental insufficiency. However, close
 surveillance is recommended to identify fetal compromise.
 I discussed frequency of monitoring that will be increased
 after 26 to 28 weeks. I discussed antenatal corticosteroids for
 fetal lung maturity. I recommended steroids after 26 weeks.
 We briefly discussed timing of delivery (earlier than 38
 weeks) that will be based on progression of Doppler and
 antenatal testing.
Recommendations

 -Weekly umbilical artery Doppler studies.
 -Fetal growth assessment in 3 weeks.
                 Afewerki, Christer

## 2019-09-21 DIAGNOSIS — F320 Major depressive disorder, single episode, mild: Secondary | ICD-10-CM | POA: Diagnosis not present

## 2019-09-23 IMAGING — US US MFM UA CORD DOPPLER
1 series · 14 of 28 positions shown · non-contrast
Comparison: none

[Series 1: us mfm ua cord doppler · 34 acquisitions, 14 frames shown]
[im 2/34]
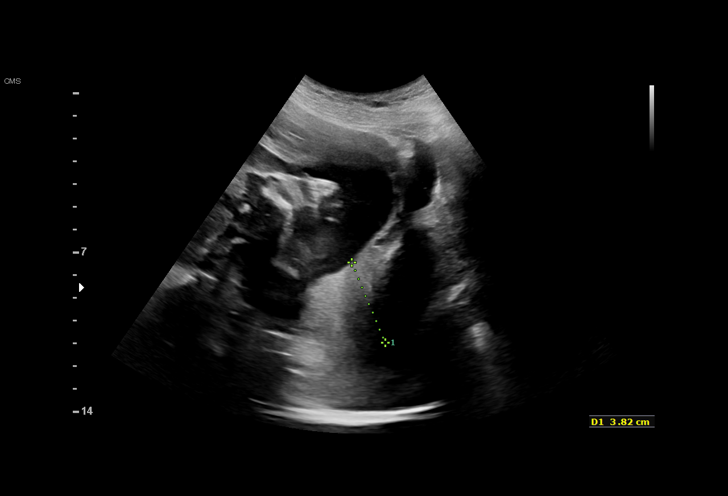
[im 4/34]
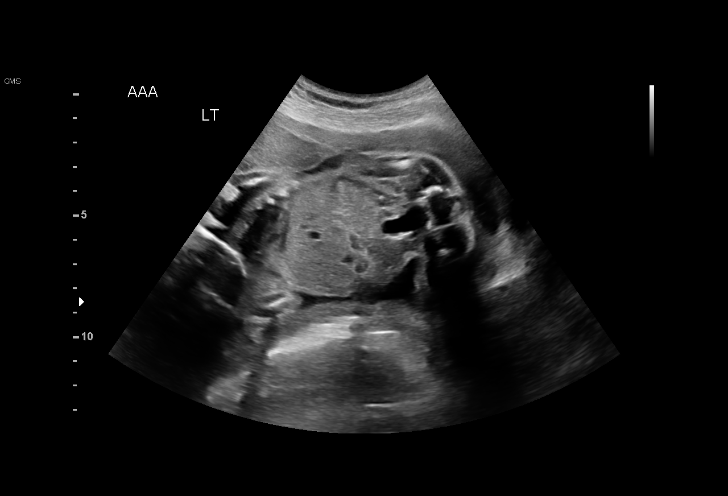
[im 7/34]
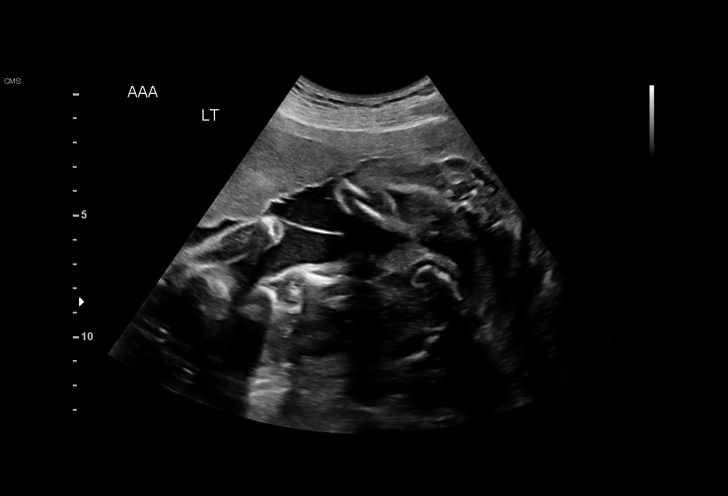
[im 9/34]
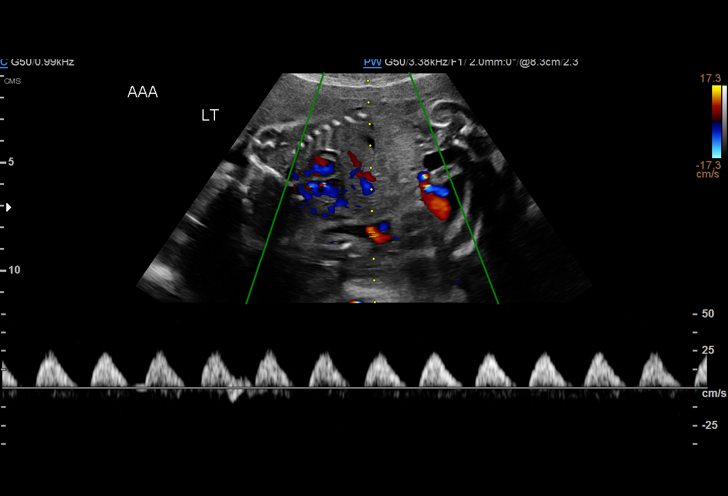
[im 12/34]
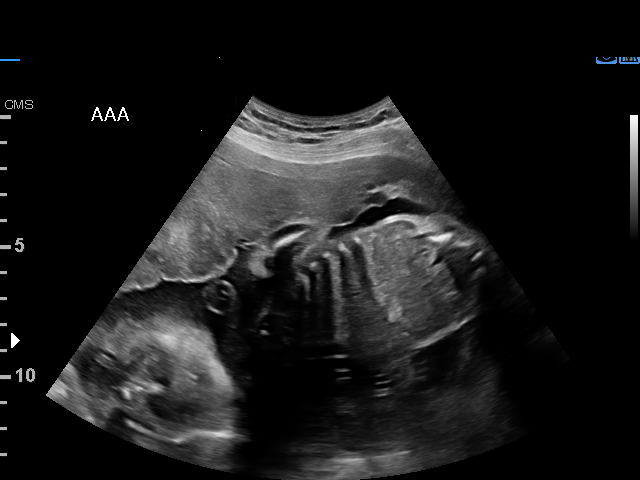
[im 14/34]
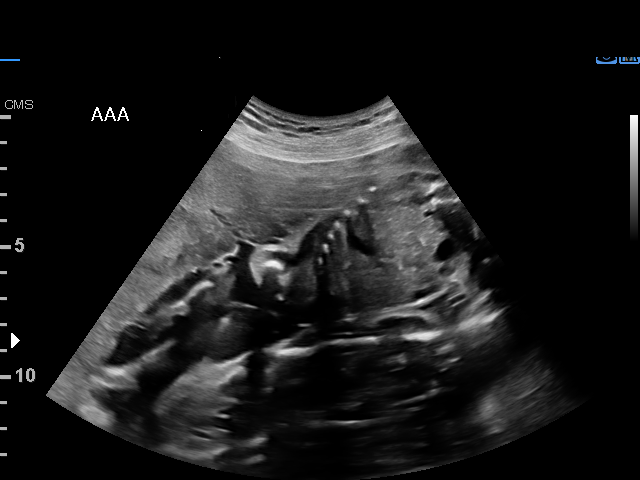
[im 16/34]
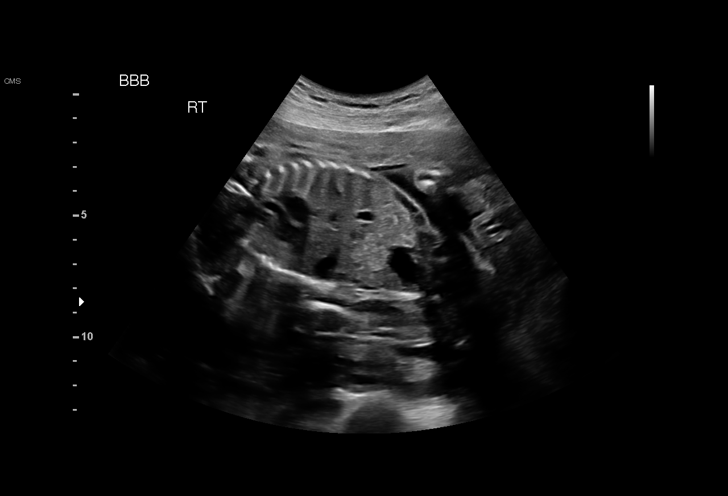
[im 19/34]
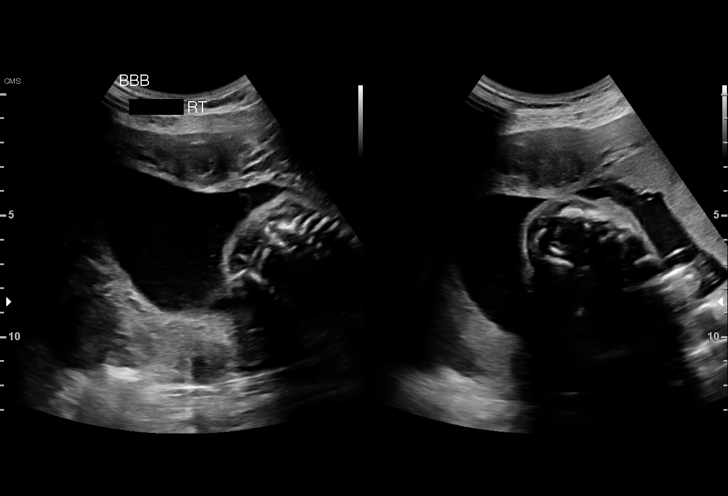
[im 21/34]
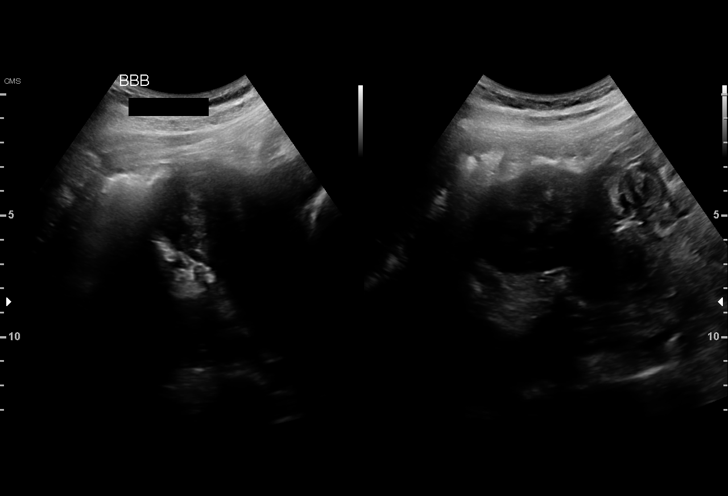
[im 24/34]
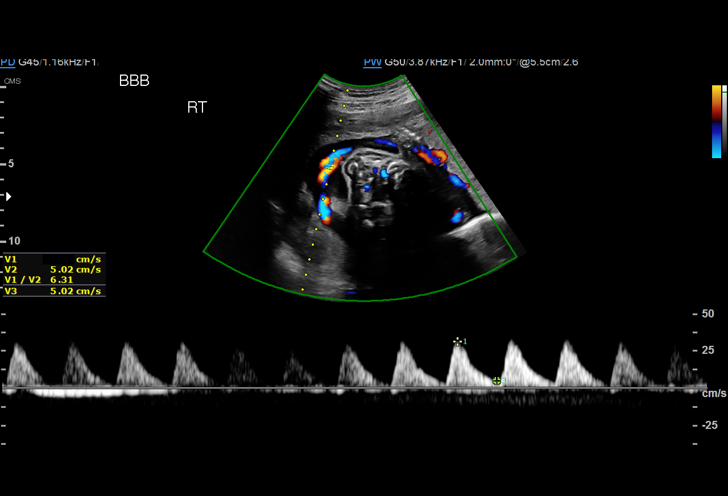
[im 26/34]
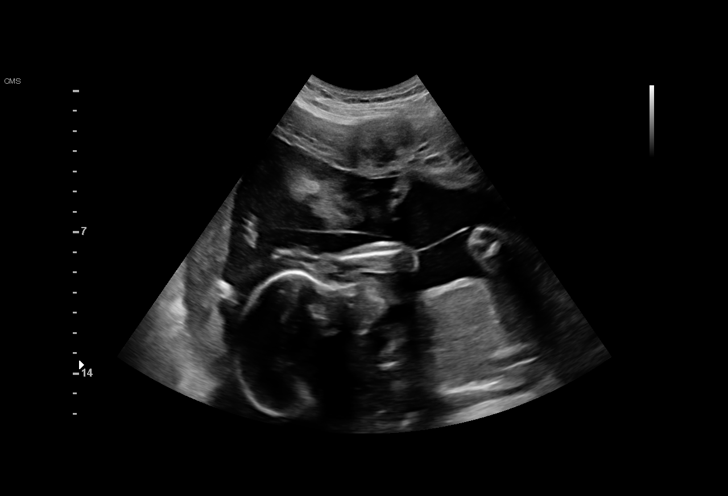
[im 29/34]
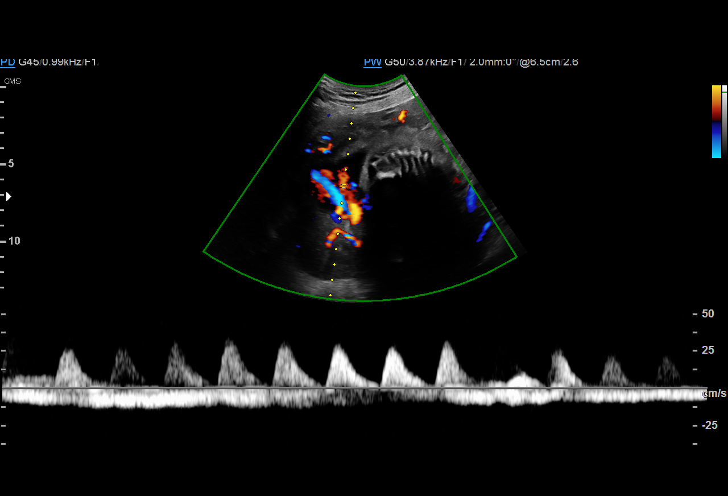
[im 31/34]
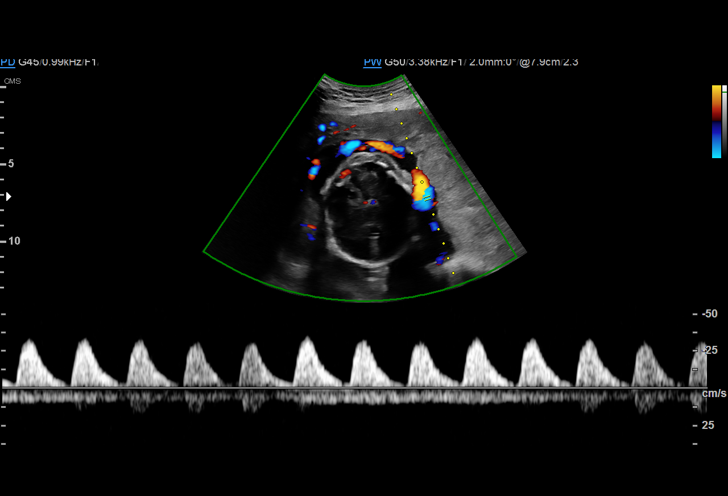
[im 34/34]
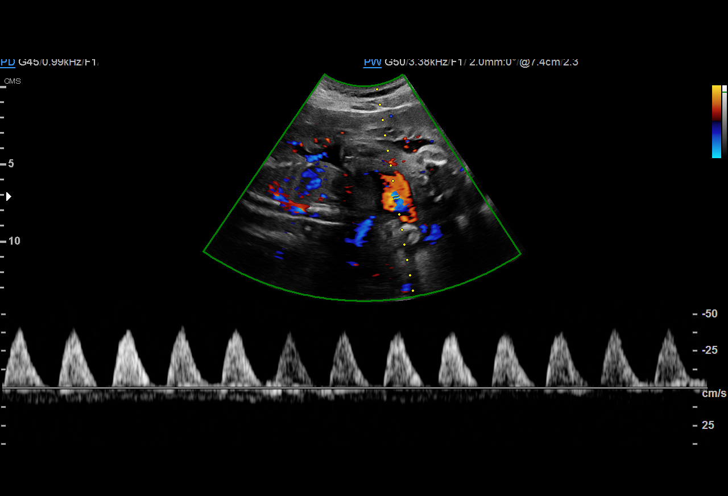

[14 of 28 positions shown; findings below may reference images not displayed]

PAULINO CANGANJO

 ----------------------------------------------------------------------

 ----------------------------------------------------------------------
Indications

  Maternal care for known or suspected poor
  fetal growth, second trimester, fetus 1
  Maternal care for known or suspected poor
  fetal growth, second trimester, fetus 2
  Twin pregnancy, Jim/Jumper, second trimester
  Uterine fibroids affecting pregnancy in        O34.12,
  second trimester, antepartum
  Uterine scar, other than C/S (Myomectomy)
  26 weeks gestation of pregnancy
 ----------------------------------------------------------------------
Vital Signs

                                                Height:        5'1"
Fetal Evaluation (Fetus A)

 Num Of Fetuses:         2
 Fetal Heart Rate(bpm):  144
 Cardiac Activity:       Observed
 Fetal Lie:              Lower Fetus
 Presentation:           Breech
 Placenta:               Anterior
 P. Cord Insertion:      Previously Visualized
 Membrane Desc:      Dividing Membrane seen - Monochorionic

 Amniotic Fluid
 AFI FV:      Within normal limits

                             Largest Pocket(cm)

OB History

 Gravidity:    3         Term:   0        Prem:   0        SAB:   1
 TOP:          1       Ectopic:  0        Living: 0
Gestational Age (Fetus A)

 LMP:           26w 1d        Date:  01/29/18                 EDD:   11/05/18
 Best:          26w 1d     Det. By:  LMP  (01/29/18)          EDD:   11/05/18
Doppler - Fetal Vessels (Fetus A)

 Umbilical Artery
                                                           ADFV    RDFV
                                                             Yes      No

Fetal Evaluation (Fetus B)

 Num Of Fetuses:         2
 Fetal Heart Rate(bpm):  144
 Cardiac Activity:       Observed
 Fetal Lie:              Upper Fetus
 Presentation:           Breech
 Placenta:               Anterior
 P. Cord Insertion:      Previously Visualized
 Membrane Desc:      Dividing Membrane seen - Monochorionic

 Amniotic Fluid
 AFI FV:      Within normal limits

                             Largest Pocket(cm)

Gestational Age (Fetus B)

 LMP:           26w 1d        Date:  01/29/18                 EDD:   11/05/18
 Best:          26w 1d     Det. By:  LMP  (01/29/18)          EDD:   11/05/18
Doppler - Fetal Vessels (Fetus B)

 Umbilical Artery
  S/D     %tile                                            ADFV    RDFV
  6.3    > 97.5                                              Yes      No

Impression

 Monochorionic-diamniotic twin pregnancy.
 Fetal growth restriction with abnormal Doppler studies.
 Patient reports good fetal movements.
 Twin A: Lower fetus, breech, anterior placenta. Amniotic fluid
 is normal and good fetal activity is seen. Fetal bladder is
 seen. Umbilical artery Doppler showed persistent absent-end-
 diastolic flow.
 Twin B: Upper fetus, breech, anterior placenta. Amniotic fluid
 is normal and good fetal activity is seen. Fetal bladder is
 seen. Umbilical artery Doppler showed intermittent absent-
 end-diastolic flow.
 I spoke with the patient and her husband (facetime).
 Explained growth restriction and the significance of abnormal
 Doppler studies.
 We will continue weekly Doppler studies till 28 weeks and
 then twice-weekly Doppler studies. I discussed timing of
 delivery that will be based on antenatal testing.
Recommendations

 -Umbilical artery Doppler studies next week.
 -UA Doppler twice-weekly from 28 weeks.
 -Fetal growth assessment in 2 weeks.
 -Antenatal corticosteroids at 28 weeks.
 -BPP from 30 weeks.
 -Husband to accompany patient at 28 weeks.
                 Becerra Flores, Alex Reynaldo

## 2019-09-28 DIAGNOSIS — F320 Major depressive disorder, single episode, mild: Secondary | ICD-10-CM | POA: Diagnosis not present

## 2019-10-10 IMAGING — US ULTRASOUND ABDOMEN LIMITED
1 series · 15 of 25 positions shown · non-contrast
Comparison: None.

CLINICAL DATA: Severe right upper quadrant pain, nausea and
vomiting. Pregnant patient in at 28 weeks with twin pregnancy.

EXAM:
ULTRASOUND ABDOMEN LIMITED RIGHT UPPER QUADRANT

[Series 1: ultrasound abdomen limited · 42 acquisitions, 15 frames shown]
[im 1/42]
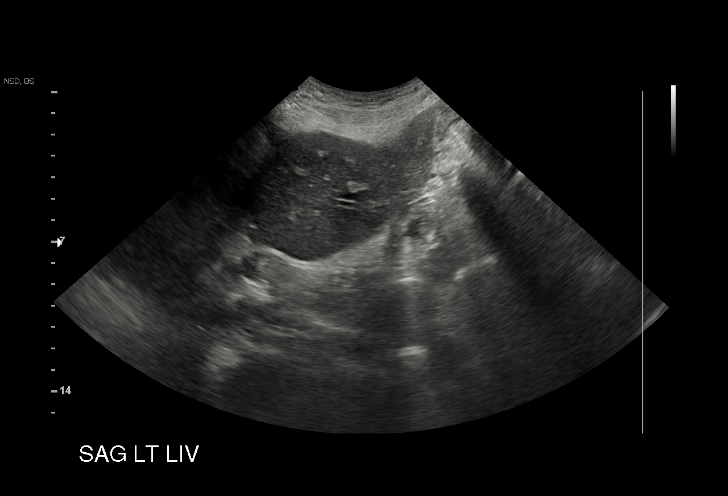
[im 4/42]
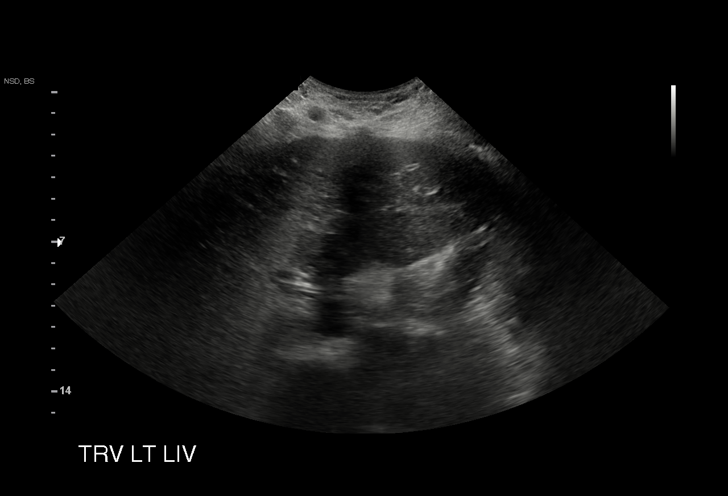
[im 7/42]
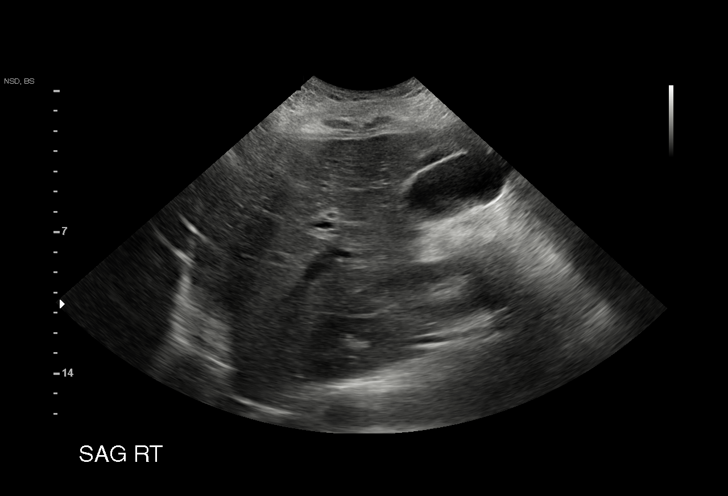
[im 9/42]
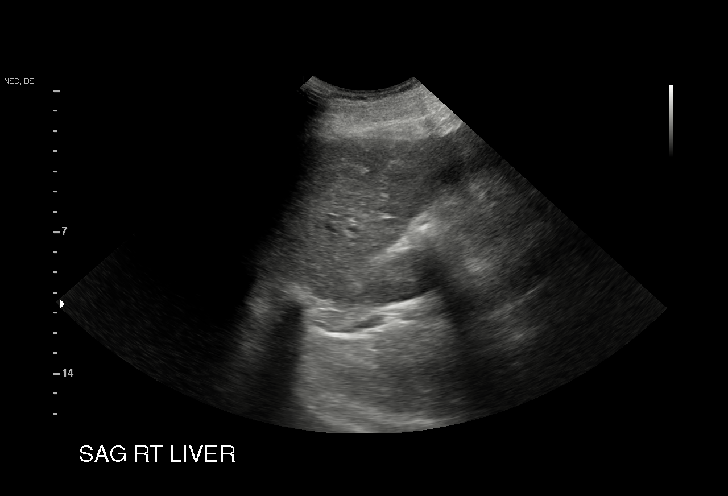
[im 12/42]
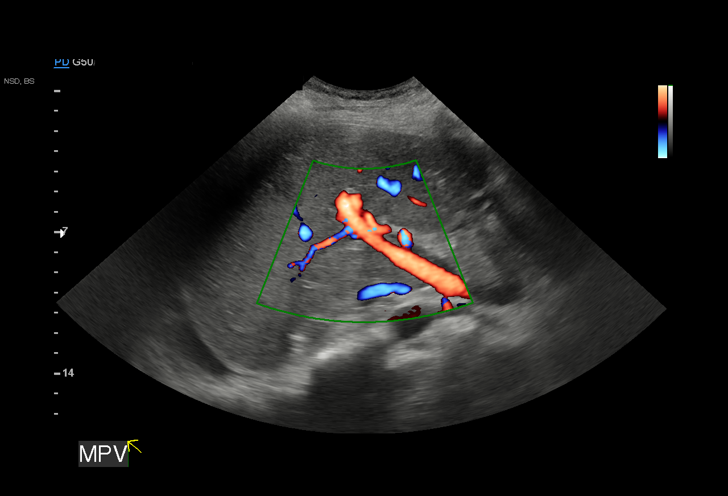
[im 16/42]
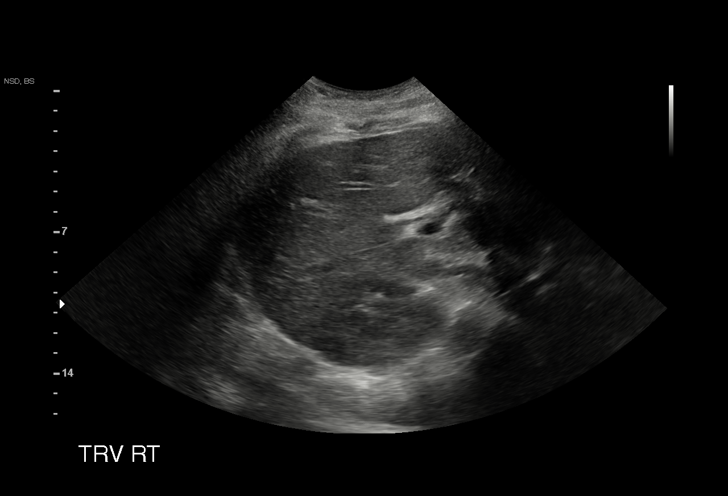
[im 18/42]
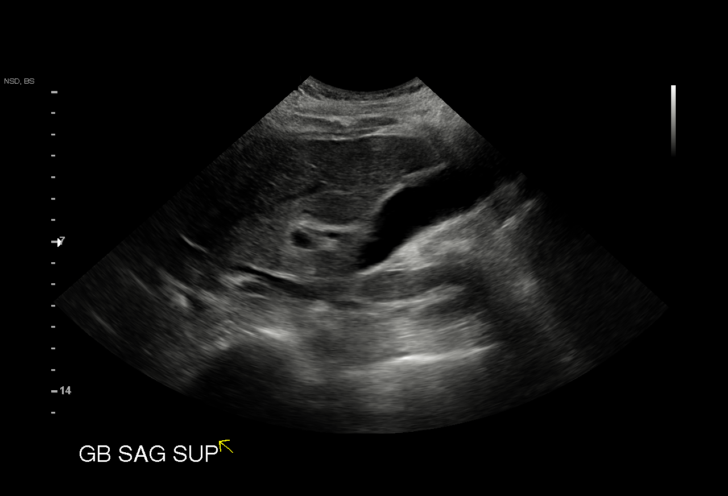
[im 21/42]
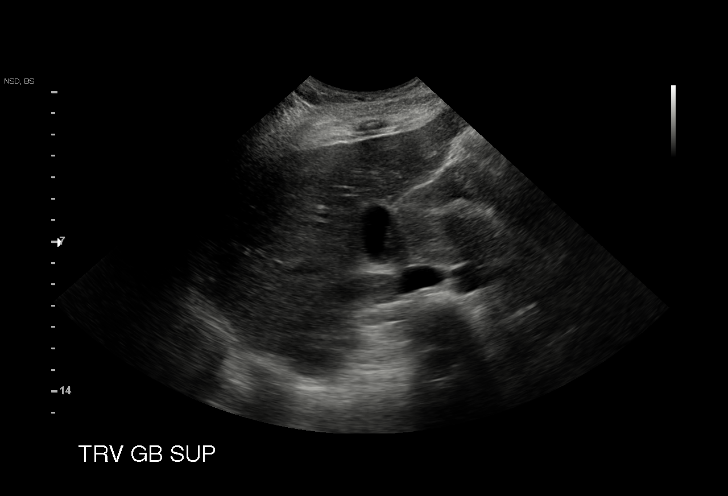
[im 24/42]
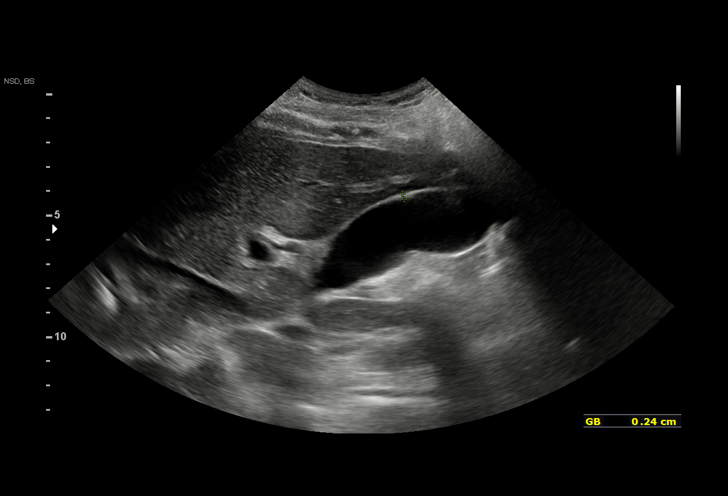
[im 26/42]
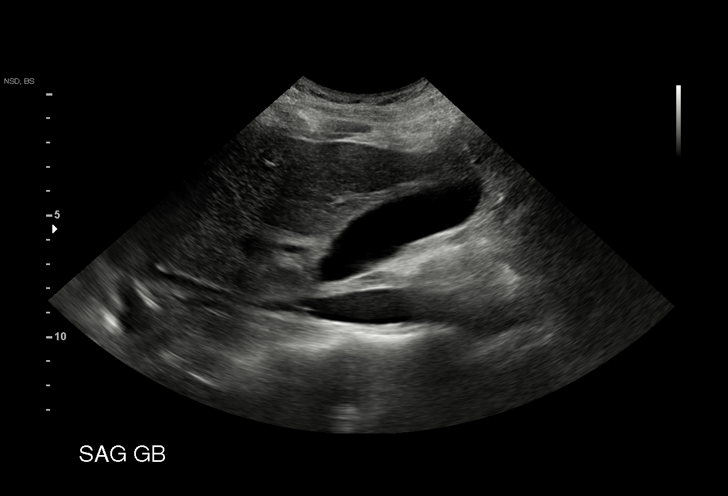
[im 30/42]
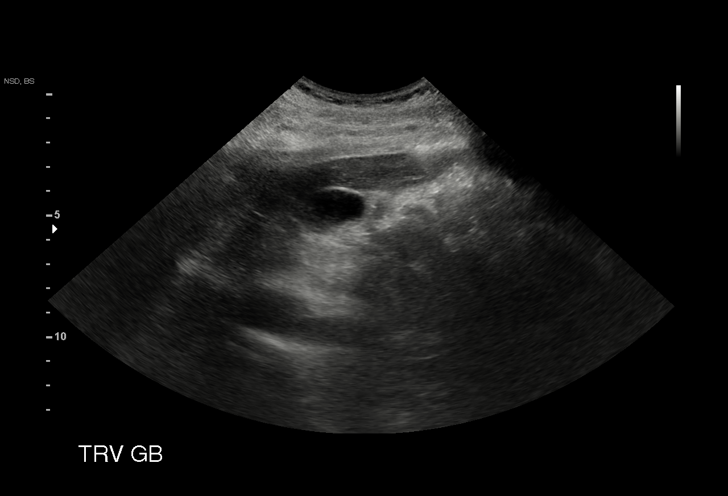
[im 33/42]
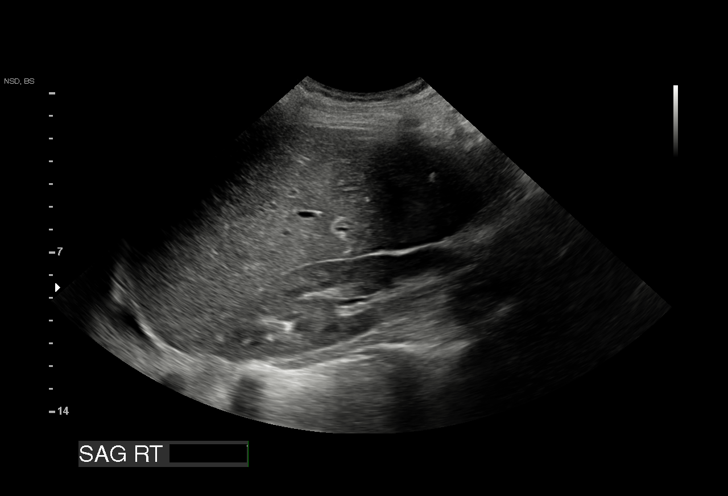
[im 35/42]
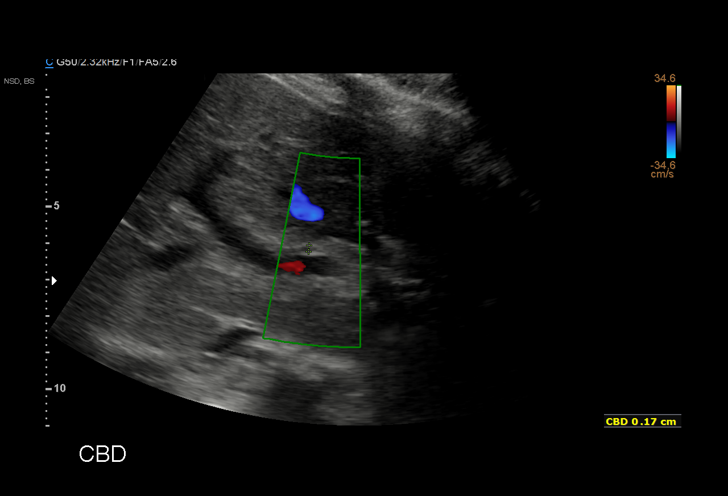
[im 38/42]
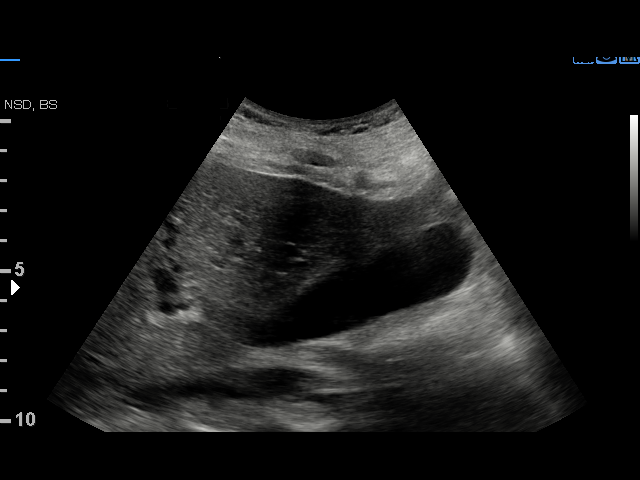
[im 42/42]
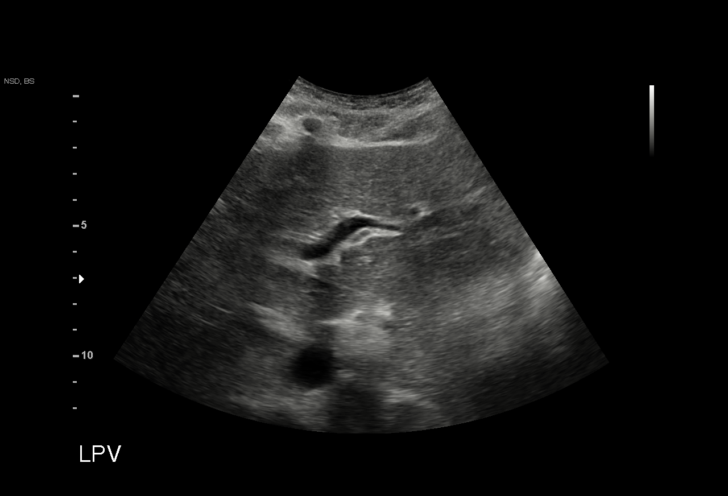

[15 of 25 positions shown; findings below may reference images not displayed]

FINDINGS: Gallbladder:

Physiologically distended. No gallstones or wall thickening
visualized. No sonographic Murphy sign noted by sonographer.

Common bile duct:

Diameter: 2 mm.

Liver:

No focal lesion identified. Within normal limits in parenchymal
echogenicity. Portal vein is patent on color Doppler imaging with
normal direction of blood flow towards the liver.

Right pleural effusion is noted. Incidental fibroid in the uterine
fundus.
IMPRESSION: 1. Normal sonographic appearance of gallbladder and biliary tree.
2. Right pleural effusion incidentally noted.

## 2020-01-28 DIAGNOSIS — L700 Acne vulgaris: Secondary | ICD-10-CM | POA: Diagnosis not present

## 2020-09-07 DIAGNOSIS — M791 Myalgia, unspecified site: Secondary | ICD-10-CM | POA: Diagnosis not present

## 2020-09-07 DIAGNOSIS — R5381 Other malaise: Secondary | ICD-10-CM | POA: Diagnosis not present

## 2020-09-07 DIAGNOSIS — U071 COVID-19: Secondary | ICD-10-CM | POA: Diagnosis not present

## 2020-09-07 DIAGNOSIS — R519 Headache, unspecified: Secondary | ICD-10-CM | POA: Diagnosis not present

## 2020-09-07 DIAGNOSIS — R059 Cough, unspecified: Secondary | ICD-10-CM | POA: Diagnosis not present

## 2021-01-08 DIAGNOSIS — R0981 Nasal congestion: Secondary | ICD-10-CM | POA: Diagnosis not present

## 2021-01-08 DIAGNOSIS — R059 Cough, unspecified: Secondary | ICD-10-CM | POA: Diagnosis not present

## 2021-01-08 DIAGNOSIS — Z20822 Contact with and (suspected) exposure to covid-19: Secondary | ICD-10-CM | POA: Diagnosis not present

## 2021-01-08 DIAGNOSIS — R062 Wheezing: Secondary | ICD-10-CM | POA: Diagnosis not present

## 2021-07-02 ENCOUNTER — Encounter (HOSPITAL_BASED_OUTPATIENT_CLINIC_OR_DEPARTMENT_OTHER): Payer: Self-pay | Admitting: *Deleted

## 2021-07-02 ENCOUNTER — Other Ambulatory Visit: Payer: Self-pay

## 2021-07-02 ENCOUNTER — Emergency Department (HOSPITAL_BASED_OUTPATIENT_CLINIC_OR_DEPARTMENT_OTHER)
Admission: EM | Admit: 2021-07-02 | Discharge: 2021-07-02 | Disposition: A | Discharge status: Home / Self Care | Payer: Medicaid Other | Attending: Emergency Medicine | Admitting: Emergency Medicine

## 2021-07-02 DIAGNOSIS — Z5321 Procedure and treatment not carried out due to patient leaving prior to being seen by health care provider: Secondary | ICD-10-CM | POA: Insufficient documentation

## 2021-07-02 DIAGNOSIS — H9201 Otalgia, right ear: Secondary | ICD-10-CM | POA: Insufficient documentation
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
## 2021-07-06 DIAGNOSIS — J029 Acute pharyngitis, unspecified: Secondary | ICD-10-CM | POA: Diagnosis not present

## 2021-07-06 DIAGNOSIS — J020 Streptococcal pharyngitis: Secondary | ICD-10-CM | POA: Diagnosis not present

## 2022-03-15 DIAGNOSIS — R0602 Shortness of breath: Secondary | ICD-10-CM | POA: Diagnosis not present

## 2022-03-15 DIAGNOSIS — R059 Cough, unspecified: Secondary | ICD-10-CM | POA: Diagnosis not present

## 2022-07-10 ENCOUNTER — Ambulatory Visit: Payer: BLUE CROSS/BLUE SHIELD | Admitting: Nurse Practitioner

## 2022-07-26 ENCOUNTER — Encounter: Payer: Self-pay | Admitting: Family Medicine

## 2022-07-26 ENCOUNTER — Ambulatory Visit (INDEPENDENT_AMBULATORY_CARE_PROVIDER_SITE_OTHER): Payer: Medicaid Other | Admitting: Family Medicine

## 2022-07-26 ENCOUNTER — Other Ambulatory Visit (HOSPITAL_COMMUNITY)
Admission: RE | Admit: 2022-07-26 | Discharge: 2022-07-26 | Disposition: A | Discharge status: Home / Self Care | Payer: Medicaid Other | Source: Ambulatory Visit | Attending: Family Medicine | Admitting: Family Medicine

## 2022-07-26 VITALS — BP 129/73 | HR 88 | Ht 60.0 in | Wt 150.0 lb

## 2022-07-26 DIAGNOSIS — Z7721 Contact with and (suspected) exposure to potentially hazardous body fluids: Secondary | ICD-10-CM | POA: Diagnosis not present

## 2022-07-26 DIAGNOSIS — Z01419 Encounter for gynecological examination (general) (routine) without abnormal findings: Secondary | ICD-10-CM | POA: Insufficient documentation

## 2022-07-26 DIAGNOSIS — Z1339 Encounter for screening examination for other mental health and behavioral disorders: Secondary | ICD-10-CM

## 2022-07-26 DIAGNOSIS — R5383 Other fatigue: Secondary | ICD-10-CM | POA: Diagnosis not present

## 2022-07-26 DIAGNOSIS — R6882 Decreased libido: Secondary | ICD-10-CM

## 2022-07-26 DIAGNOSIS — Z30431 Encounter for routine checking of intrauterine contraceptive device: Secondary | ICD-10-CM

## 2022-07-26 NOTE — Progress Notes (Signed)
Pt c/o extreme fatigue

## 2022-07-27 ENCOUNTER — Encounter: Payer: Self-pay | Admitting: Family Medicine

## 2022-07-27 ENCOUNTER — Telehealth: Payer: Self-pay

## 2022-07-27 LAB — COMPREHENSIVE METABOLIC PANEL
ALT: 8 IU/L (ref 0–32)
AST: 14 IU/L (ref 0–40)
Albumin/Globulin Ratio: 1.2 (ref 1.2–2.2)
Albumin: 4.2 g/dL (ref 3.9–4.9)
Alkaline Phosphatase: 35 IU/L — ABNORMAL LOW (ref 44–121)
BUN/Creatinine Ratio: 12 (ref 9–23)
BUN: 10 mg/dL (ref 6–20)
Bilirubin Total: 0.3 mg/dL (ref 0.0–1.2)
CO2: 22 mmol/L (ref 20–29)
Calcium: 9.2 mg/dL (ref 8.7–10.2)
Chloride: 102 mmol/L (ref 96–106)
Creatinine, Ser: 0.82 mg/dL (ref 0.57–1.00)
Globulin, Total: 3.4 g/dL (ref 1.5–4.5)
Glucose: 93 mg/dL (ref 70–99)
Potassium: 4.1 mmol/L (ref 3.5–5.2)
Sodium: 137 mmol/L (ref 134–144)
Total Protein: 7.6 g/dL (ref 6.0–8.5)
eGFR: 93 mL/min/{1.73_m2} (ref >59)

## 2022-07-27 LAB — HEPATITIS C ANTIBODY: Hep C Virus Ab: NONREACTIVE

## 2022-07-27 LAB — HIV ANTIBODY (ROUTINE TESTING W REFLEX): HIV Screen 4th Generation wRfx: NONREACTIVE

## 2022-07-27 LAB — RPR: RPR Ser Ql: NONREACTIVE

## 2022-07-27 LAB — VITAMIN D 25 HYDROXY (VIT D DEFICIENCY, FRACTURES): Vit D, 25-Hydroxy: 9.4 ng/mL — ABNORMAL LOW (ref 30.0–100.0)

## 2022-07-27 LAB — HEMOGLOBIN A1C
Est. average glucose Bld gHb Est-mCnc: 120 mg/dL
Hgb A1c MFr Bld: 5.8 % — ABNORMAL HIGH (ref 4.8–5.6)

## 2022-07-27 LAB — HEPATITIS B SURFACE ANTIGEN: Hepatitis B Surface Ag: NEGATIVE

## 2022-07-27 MED ORDER — VITAMIN D (ERGOCALCIFEROL) 1.25 MG (50000 UNIT) PO CAPS: 50000.0000 [IU] | ORAL_CAPSULE | ORAL | 0 refills | Status: DC

## 2022-07-27 MED ORDER — BUPROPION HCL ER (XL) 150 MG PO TB24
150.0000 mg | ORAL_TABLET | Freq: Every day | ORAL | 1 refills | Status: DC
Start: 2022-07-27 — End: 2024-02-13

## 2022-07-27 NOTE — Telephone Encounter (Signed)
Spoke to [employer] at [facility]. TSH and CBC will be added to patient's labs.  Dawn Mayo l Dawn Mayo, CMA

## 2022-07-27 NOTE — Telephone Encounter (Signed)
-----   Message from Levie Heritage, DO sent at 07/27/2022 10:41 AM EDT ----- Regarding: labs She was also supposed to get TSH and CBC - these don't look like they were drawn. Can you check on this?

## 2022-07-27 NOTE — Progress Notes (Signed)
ANNUAL EXAM Patient name: Doneta Bayman MRN [id number]  Date of birth: [birth year] Chief Complaint:   Annual Exam  History of Present Illness:   Lanny Donoso is a 37 y.o.  [id number]  female  being seen today for a routine annual exam.  Current complaints: severe fatigue. This has been worsening over the past several months. She feels exhausted all the time and can fall asleep at any moment. Her youngest two are twins at 39 years old. They do not sleep through the night and will often wake up 1-2 times a night. Usually, they fall asleep quickly, but it takes her a little longer to fall asleep.   She has very little interest in sex currently. Doesn't feel particularly depressed. Does have some increased anxiety/racing thoughts. Appetite is normal.  No LMP recorded. (Menstrual status: IUD).    Last pap 2020. Results were: NILM w/ HRHPV positive: type not specified. H/O abnormal pap: no Last mammogram: n/a     07/26/2022    2:51 PM  Depression screen PHQ 2/9  Decreased Interest 1  Down, Depressed, Hopeless 0  PHQ - 2 Score 1  Altered sleeping 0  Tired, decreased energy 2  Change in appetite 1  Feeling bad or failure about yourself  0  Trouble concentrating 1  Moving slowly or fidgety/restless 0  Suicidal thoughts 0  PHQ-9 Score 5        07/26/2022    2:52 PM  GAD 7 : Generalized Anxiety Score  Nervous, Anxious, on Edge 1  Control/stop worrying 1  Worry too much - different things 0  Trouble relaxing 0  Restless 0  Easily annoyed or irritable 0  Afraid - awful might happen 1  Total GAD 7 Score 3     Review of Systems:   Pertinent items are noted in HPI Denies any headaches, blurred vision, fatigue, shortness of breath, chest pain, abdominal pain, abnormal vaginal discharge/itching/odor/irritation, problems with periods, bowel movements, urination, or intercourse unless otherwise stated above. Pertinent History Reviewed:  Reviewed past medical,surgical, social and family  history.  Reviewed problem list, medications and allergies. Physical Assessment:   Vitals:   07/26/22 1441  BP: 129/73  Pulse: 88  Weight: 150 lb (68 kg)  Height: 5' (1.524 m)  Body mass index is 29.29 kg/m.        Physical Examination:   General appearance - well appearing, and in no distress  Mental status - alert, oriented to person, place, and time  Psych:  She has a normal mood and affect  Skin - warm and dry, normal color, no suspicious lesions noted  Chest - effort normal, all lung fields clear to auscultation bilaterally  Heart - normal rate and regular rhythm  Neck:  midline trachea, no thyromegaly or nodules  Breasts - breasts appear normal, no suspicious masses, no skin or nipple changes or axillary nodes  Abdomen - soft, nontender, nondistended, no masses or organomegaly  Pelvic - VULVA: normal appearing vulva with no masses, tenderness or lesions  VAGINA: normal appearing vagina with normal color and discharge, no lesions  CERVIX: normal appearing cervix without discharge or lesions, no CMT  Thin prep pap is done with HR HPV cotesting  IUD strings seen  UTERUS: uterus is felt to be normal size, shape, consistency and nontender   ADNEXA: No adnexal masses or tenderness noted.  Extremities:  No swelling or varicosities noted  Chaperone present for exam  Assessment & Plan:  1. Well woman exam with routine  gynecological exam - Cytology - PAP - TSH - RPR - HIV Antibody (routine testing w rflx) - Hepatitis C antibody - Hepatitis B surface antigen - Hemoglobin A1c - Comprehensive metabolic panel - CBC  2. Exposure to body fluid Check STI screening - HIV Antibody (routine testing w rflx) - Hepatitis C antibody - Hepatitis B surface antigen  3. Other fatigue Possibly multifactorial. Check TSH, CBC, CMP, Vit D. Possibly due to accumulative effect of not sleeping through the night for the past 4 years. A Unfortunately, there will likely not be an easy answer to  this, but will screen for metabolic contributors.  - Vitamin D (25 hydroxy)  4. IUD check up Strings present  5. Decreased libido Discussed that this is multifactorial - secondary to fatigue, stress, anxiety. Discussed ways to improve these secondary items. We discussed possible use of wellbutrin - patient interested in trying. Discussed potential side effects: palpitations, weight loss/decrease appetite, may help or worsen anxiety, may feel restless. F/u in 3 months.   Labs/procedures today:   Orders Placed This Encounter  Procedures   Vitamin D (25 hydroxy)   TSH   RPR   HIV Antibody (routine testing w rflx)   Hepatitis C antibody   Hepatitis B surface antigen   Hemoglobin A1c   Comprehensive metabolic panel   CBC    Meds: No orders of the defined types were placed in this encounter.   Follow-up: No follow-ups on file.  Levie Heritage, DO 07/27/2022 10:42 AM

## 2022-07-30 LAB — CBC WITH DIFFERENTIAL/PLATELET
Basophils Absolute: 0.1 10*3/uL (ref 0.0–0.2)
Basos: 1 %
EOS (ABSOLUTE): 1.2 10*3/uL — ABNORMAL HIGH (ref 0.0–0.4)
Eos: 14 %
Hematocrit: 40.5 % (ref 34.0–46.6)
Hemoglobin: 13 g/dL (ref 11.1–15.9)
Immature Grans (Abs): 0 10*3/uL (ref 0.0–0.1)
Immature Granulocytes: 0 %
Lymphocytes Absolute: 2.4 10*3/uL (ref 0.7–3.1)
Lymphs: 27 %
MCH: 29 pg (ref 26.6–33.0)
MCHC: 32.1 g/dL (ref 31.5–35.7)
MCV: 90 fL (ref 79–97)
Monocytes Absolute: 0.3 10*3/uL (ref 0.1–0.9)
Monocytes: 4 %
Neutrophils Absolute: 5 10*3/uL (ref 1.4–7.0)
Neutrophils: 54 %
Platelets: 391 10*3/uL (ref 150–450)
RBC: 4.49 x10E6/uL (ref 3.77–5.28)
RDW: 13.2 % (ref 11.7–15.4)
WBC: 9.1 10*3/uL (ref 3.4–10.8)

## 2022-07-30 LAB — SPECIMEN STATUS REPORT

## 2022-07-30 LAB — TSH: TSH: 2.33 u[IU]/mL (ref 0.450–4.500)

## 2022-08-01 LAB — CYTOLOGY - PAP
Chlamydia: POSITIVE — AB
Comment: NEGATIVE
Comment: NEGATIVE
Comment: NEGATIVE
Comment: NORMAL
Diagnosis: NEGATIVE
High risk HPV: NEGATIVE
Neisseria Gonorrhea: NEGATIVE
Trichomonas: POSITIVE — AB

## 2022-08-01 MED ORDER — AZITHROMYCIN 250 MG PO TABS: 1000.0000 mg | ORAL_TABLET | Freq: Once | ORAL | 1 refills | Status: AC

## 2022-08-01 MED ORDER — METRONIDAZOLE 500 MG PO TABS
500.0000 mg | ORAL_TABLET | Freq: Two times a day (BID) | ORAL | 0 refills | Status: DC
Start: 2022-08-01 — End: 2022-09-07

## 2022-08-01 MED ORDER — ONDANSETRON 4 MG PO TBDP: 4.0000 mg | ORAL_TABLET | Freq: Four times a day (QID) | ORAL | 0 refills | Status: DC | PRN

## 2022-08-01 NOTE — Addendum Note (Signed)
Addended by: Levie Heritage on: 08/01/2022 04:17 PM   Modules accepted: Orders

## 2022-08-14 ENCOUNTER — Encounter: Payer: BLUE CROSS/BLUE SHIELD | Admitting: Nurse Practitioner

## 2022-08-22 ENCOUNTER — Encounter: Payer: Self-pay | Admitting: Family Medicine

## 2022-08-22 ENCOUNTER — Ambulatory Visit (INDEPENDENT_AMBULATORY_CARE_PROVIDER_SITE_OTHER): Payer: Medicaid Other | Admitting: Family Medicine

## 2022-08-22 VITALS — BP 110/60 | HR 85 | Temp 98.4°F | Ht 60.0 in | Wt 149.6 lb

## 2022-08-22 DIAGNOSIS — K921 Melena: Secondary | ICD-10-CM

## 2022-08-22 DIAGNOSIS — G44021 Chronic cluster headache, intractable: Secondary | ICD-10-CM

## 2022-08-22 DIAGNOSIS — Z7689 Persons encountering health services in other specified circumstances: Secondary | ICD-10-CM | POA: Diagnosis not present

## 2022-08-22 DIAGNOSIS — R5383 Other fatigue: Secondary | ICD-10-CM | POA: Diagnosis not present

## 2022-08-22 DIAGNOSIS — L308 Other specified dermatitis: Secondary | ICD-10-CM

## 2022-08-22 DIAGNOSIS — G43709 Chronic migraine without aura, not intractable, without status migrainosus: Secondary | ICD-10-CM | POA: Diagnosis not present

## 2022-08-22 MED ORDER — TOPIRAMATE 25 MG PO TABS: 25.0000 mg | ORAL_TABLET | Freq: Every day | ORAL | 2 refills | Status: DC

## 2022-08-22 MED ORDER — TRIAMCINOLONE ACETONIDE 0.5 % EX OINT: 1.0000 | TOPICAL_OINTMENT | Freq: Two times a day (BID) | CUTANEOUS | 0 refills | Status: AC

## 2022-08-22 NOTE — Progress Notes (Unsigned)
Hershal Coria Lazarius Rivkin,acting as a Neurosurgeon for [employer], NP.,have documented all relevant documentation on the behalf of PAT OHENHEN, NP,as directed by  PAT Moshe Salisbury, NP while in the presence of PAT OHENHEN, NP.    Subjective:     Patient ID: Dawn Mayo , female    DOB: 24-May-1985 , 37 y.o.   MRN: [id number]   Chief Complaint  Patient presents with   Establish Care         HPI  Patient presents today to establish care would like to discuss certain concerns . She states she has being experiencing  fatigue for a while and would like to have labs drawn to rule out anemia.Patient is also concerned about episodes blood in her stool that occurs frequently without constipation or hemorrhoids  Patient reports she also has eczema on her arm that she reports is "going crazy".  Patient also reports having a headache everyday, that does not report to her treatment with Tylenol/Ibuprofen BP Readings from Last 3 Encounters: 08/22/22 : 110/60 07/26/22 : 129/73 07/02/21 : 124/76       Past Medical History:  Diagnosis Date   Anemia    Asthma    AS CHILD   Eczema    Fibroids    H/O myomectomy    right 2014   Headache    HPV (human papilloma virus) infection    Ovarian cyst    Pneumonia    Supervision of normal pregnancy, antepartum 04/28/2018    Nursing Staff Provider Office Location  [facility] Dating  LMP 01/29/2018 Language   English Anatomy US   Flu Vaccine   04/29/17 Genetic Screen  NIPS: low risks  AFP:      TDaP vaccine  Declined 08/04/18 Hgb A1C or  GTT Early  Third trimester  Rhogam     LAB RESULTS  Feeding Plan  breastfeed Blood Type    Contraception  undecided Antibody   Circumcision yes if boy [place]   Pediatrician   undecided RP   Trichimoniasis      Family History  Problem Relation Age of Onset   Miscarriages / Stillbirths Mother    Asthma Brother    Arthritis Maternal Grandmother    Diabetes Maternal Grandmother    Hypertension Maternal Grandmother    Stroke Maternal  Grandmother    Cancer Paternal Grandmother      Current Outpatient Medications:    metroNIDAZOLE (FLAGYL) 500 MG tablet, Take 1 tablet (500 mg total) by mouth 2 (two) times daily., Disp: 14 tablet, Rfl: 0   topiramate (TOPAMAX) 25 MG tablet, Take 1 tablet (25 mg total) by mouth daily., Disp: 30 tablet, Rfl: 2   triamcinolone ointment (KENALOG) 0.5 %, Apply 1 Application topically 2 (two) times daily for 5 days., Disp: 10 g, Rfl: 0   Vitamin D, Ergocalciferol, (DRISDOL) 1.25 MG (50000 UNIT) CAPS capsule, Take 1 capsule (50,000 Units total) by mouth every 7 (seven) days., Disp: 10 capsule, Rfl: 0   AMBULATORY NON FORMULARY MEDICATION, Use as directed 1 tablet in the mouth or throat. Moringa 2000mg  (twice a day) (Patient not taking: Reported on 07/26/2022), Disp: , Rfl:    AMBULATORY NON FORMULARY MEDICATION, Use as directed 1 mL in the mouth or throat. Lativist- 1 ml three times a day. (Patient not taking: Reported on 07/26/2022), Disp: , Rfl:    Brewers Yeast 500 MG TABS, Take by mouth. (Patient not taking: Reported on 07/26/2022), Disp: , Rfl:    buPROPion (WELLBUTRIN XL) 150 MG 24 hr tablet,  Take 1 tablet (150 mg total) by mouth daily. (Patient not taking: Reported on 08/22/2022), Disp: 90 tablet, Rfl: 1   ibuprofen (ADVIL) 800 MG tablet, Take 1 tablet (800 mg total) by mouth 3 (three) times daily with meals as needed for headache or moderate pain. (Patient not taking: Reported on 11/17/2018), Disp: 45 tablet, Rfl: 3   medroxyPROGESTERone (PROVERA) 10 MG tablet, Take 1 tablet (10 mg total) by mouth daily. (Patient not taking: Reported on 08/22/2022), Disp: 30 tablet, Rfl: 0   metoCLOPramide (REGLAN) 10 MG tablet, Take 1 tablet (10 mg total) by mouth 3 (three) times daily before meals. (Patient not taking: Reported on 07/26/2022), Disp: 90 tablet, Rfl: 3   Misc Natural Products (MILKFLOW PO), Take by mouth. (Patient not taking: Reported on 07/26/2022), Disp: , Rfl:    ondansetron (ZOFRAN-ODT) 4 MG  disintegrating tablet, Take 1 tablet (4 mg total) by mouth every 6 (six) hours as needed for nausea., Disp: 20 tablet, Rfl: 0   Prenatal Vit-Fe Fumarate-FA (PRENATAL MULTIVITAMIN) TABS tablet, Take 1 tablet by mouth daily at 12 noon. (Patient not taking: Reported on 07/26/2022), Disp: , Rfl:    No Known Allergies   Review of Systems  HENT: Negative.    Endocrine: Negative.   Genitourinary: Negative.   Musculoskeletal: Negative.   Psychiatric/Behavioral: Negative.       Today's Vitals   08/22/22 1042  BP: 110/60  Pulse: 85  Temp: 98.4 F (36.9 C)  TempSrc: Oral  Weight: 149 lb 9.6 oz (67.9 kg)  Height: 5' (1.524 m)   Body mass index is 29.22 kg/m.  Wt Readings from Last 3 Encounters:  08/22/22 149 lb 9.6 oz (67.9 kg)  07/26/22 150 lb (68 kg)  07/02/21 142 lb (64.4 kg)    The ASCVD Risk score (Arnett DK, et al., 2019) failed to calculate for the following reasons:   The 2019 ASCVD risk score is only valid for ages 6 to 48 ++ Objective:  Physical Exam      Assessment And Plan:     1. Establishing care with new doctor, encounter for  2. Other eczema  3. Fatigue, unspecified type - Iron, TIBC and Ferritin Panel - Vitamin D (25 hydroxy) - Vitamin B12 - CBC no Diff  4. Blood in stool - CBC no Diff - Ambulatory referral to Gastroenterology  5. Intractable chronic cluster headache    Return in about 4 months (around 12/23/2022) for phy when able.  Patient was given opportunity to ask questions. Patient verbalized understanding of the plan and was able to repeat key elements of the plan. All questions were answered to their satisfaction.  PAT Moshe Salisbury, NP   I, PAT Moshe Salisbury, NP, have reviewed all documentation for this visit. The documentation on 08/22/22 for the exam, diagnosis, procedures, and orders are all accurate and complete.   IF YOU HAVE BEEN REFERRED TO A SPECIALIST, IT MAY TAKE 1-2 WEEKS TO SCHEDULE/PROCESS THE REFERRAL. IF YOU HAVE NOT HEARD FROM  US/SPECIALIST IN TWO WEEKS, PLEASE GIVE [place] A CALL AT [phone].   THE PATIENT IS ENCOURAGED TO PRACTICE SOCIAL DISTANCING DUE TO THE COVID-19 PANDEMIC.

## 2022-08-22 NOTE — Patient Instructions (Signed)
Eczema Eczema refers to a group of skin conditions that cause skin to become rough and inflamed. Each type of eczema has different triggers, symptoms, and treatments. Eczema of any type is usually itchy. Symptoms range from mild to severe. Eczema is not spread from person to person (is not contagious). It can appear on different parts of the body at different times. One person's eczema may look different from another person's eczema. What are the causes? The exact cause of this condition is not known. However, exposure to certain environmental factors, irritants, and allergens can make the condition worse. What are the signs or symptoms? Symptoms of this condition depend on the type of eczema you have. The types include: Contact dermatitis. There are two kinds: Irritant contact dermatitis. This happens when something irritates the skin and causes a rash. Allergic contact dermatitis. This happens when your skin comes in contact with something you are allergic to (allergens). This can include poison ivy, chemicals, or medicines that were applied to your skin. Atopic dermatitis. This is a long-term (chronic) skin disease that keeps coming back (recurring). It is the most common type of eczema. Usual symptoms are a red rash and itchy, dry, scaly skin. It usually starts showing signs in infancy and can last through adulthood. Dyshidrotic eczema. This is a form of eczema on the hands and feet. It shows up as very itchy, fluid-filled blisters. It can affect people of any age but is more common before age 40. Hand eczema. This causes very itchy areas of skin on the palms and sides of the hands and fingers. This type of eczema is common in industrial jobs where you may be exposed to different types of irritants. Lichen simplex chronicus. This type of eczema occurs when a person constantly scratches one area of the body. Repeated scratching of the area leads to thickened skin (lichenification). This condition can  accompany other types of eczema. It is more common in adults but may also be seen in children. Nummular eczema. This is a common type of eczema that most often affects the lower legs and the backs of the hands. It typically causes an itchy, red, circular, crusty lesion (plaque). Scratching may become a habit and can cause bleeding. Nummular eczema occurs most often in middle-aged or older people. Seborrheic dermatitis. This is a common skin disease that mainly affects the scalp. It may also affect other oily areas of the body, such as the face, sides of the nose, eyebrows, ears, eyelids, and chest. It is marked by small scaling and redness of the skin (erythema). This can affect people of all ages. In infants, this condition is called cradle cap. Stasis dermatitis. This is a common skin disease that can cause itching, scaling, and hyperpigmentation, usually on the legs and feet. It occurs most often in people who have a condition that prevents blood from being pumped through the veins in the legs (chronic venous insufficiency). Stasis dermatitis is a chronic condition that needs long-term management. How is this diagnosed? This condition may be diagnosed based on: A physical exam of your skin. Your medical history. Skin patch tests. These tests involve using patches that contain possible allergens and placing them on your back. Your health care provider will check in a few days to see if an allergic reaction occurred. How is this treated? Treatment for eczema is based on the type of eczema you have. You may be given hydrocortisone steroid medicine or antihistamines. These can relieve itching quickly and help reduce inflammation.   These may be prescribed or purchased over the counter, depending on the strength that is needed. Follow these instructions at home: Take or apply over-the-counter and prescription medicines only as told by your health care provider. Use creams or ointments to moisturize your  skin. Do not use lotions. Learn what triggers or irritates your symptoms so you can avoid these things. Treat symptom flare-ups quickly. Do not scratch your skin. This can make your rash worse. Keep all follow-up visits. This is important. Where to find more information American Academy of Dermatology: aad.org National Eczema Association: nationaleczema.org The Society for Pediatric Dermatology: pedsderm.net Contact a health care provider if: You have severe itching, even with treatment. You scratch your skin regularly until it bleeds. Your rash looks different than usual. Your skin is painful, swollen, or more red than usual. You have a fever. Summary Eczema refers to a group of skin conditions that cause skin to become rough and inflamed. Each type has different triggers. Eczema of any type causes itching that may range from mild to severe. Treatment varies based on the type of eczema you have. Hydrocortisone steroid medicine or antihistamines can help with itching and inflammation. Protecting your skin is the best way to prevent eczema. Use creams or ointments to moisturize your skin. Avoid triggers and irritants. Treat flare-ups quickly. This information is not intended to replace advice given to you by your health care provider. Make sure you discuss any questions you have with your health care provider. Document Revised: 12/28/2019 Document Reviewed: 12/28/2019 Elsevier Patient Education  2023 Elsevier Inc.  

## 2022-08-23 DIAGNOSIS — G43709 Chronic migraine without aura, not intractable, without status migrainosus: Secondary | ICD-10-CM | POA: Insufficient documentation

## 2022-08-23 DIAGNOSIS — L309 Dermatitis, unspecified: Secondary | ICD-10-CM | POA: Insufficient documentation

## 2022-08-23 DIAGNOSIS — K921 Melena: Secondary | ICD-10-CM | POA: Insufficient documentation

## 2022-08-23 DIAGNOSIS — R5383 Other fatigue: Secondary | ICD-10-CM | POA: Insufficient documentation

## 2022-08-23 LAB — IRON,TIBC AND FERRITIN PANEL
Ferritin: 73 ng/mL (ref 15–150)
Iron Saturation: 25 % (ref 15–55)
Iron: 79 ug/dL (ref 27–159)
Total Iron Binding Capacity: 312 ug/dL (ref 250–450)
UIBC: 233 ug/dL (ref 131–425)

## 2022-08-23 LAB — CBC
Hematocrit: 40.4 % (ref 34.0–46.6)
Hemoglobin: 13.1 g/dL (ref 11.1–15.9)
MCH: 28.6 pg (ref 26.6–33.0)
MCHC: 32.4 g/dL (ref 31.5–35.7)
MCV: 88 fL (ref 79–97)
Platelets: 398 10*3/uL (ref 150–450)
RBC: 4.58 x10E6/uL (ref 3.77–5.28)
RDW: 12.8 % (ref 11.7–15.4)
WBC: 7.5 10*3/uL (ref 3.4–10.8)

## 2022-08-23 LAB — VITAMIN B12: Vitamin B-12: 580 pg/mL (ref 232–1245)

## 2022-08-23 LAB — VITAMIN D 25 HYDROXY (VIT D DEFICIENCY, FRACTURES): Vit D, 25-Hydroxy: 30.8 ng/mL (ref 30.0–100.0)

## 2022-08-24 LAB — FOLATE: Folate: 9.3 ng/mL (ref >3.0)

## 2022-08-24 LAB — SPECIMEN STATUS REPORT

## 2022-08-30 NOTE — Progress Notes (Signed)
Normal labs.

## 2022-09-05 ENCOUNTER — Other Ambulatory Visit (HOSPITAL_COMMUNITY)
Admission: RE | Admit: 2022-09-05 | Discharge: 2022-09-05 | Disposition: A | Discharge status: Home / Self Care | Payer: Medicaid Other | Source: Ambulatory Visit | Attending: Obstetrics & Gynecology | Admitting: Obstetrics & Gynecology

## 2022-09-05 ENCOUNTER — Ambulatory Visit (INDEPENDENT_AMBULATORY_CARE_PROVIDER_SITE_OTHER): Payer: Medicaid Other

## 2022-09-05 VITALS — BP 123/73 | HR 82

## 2022-09-05 DIAGNOSIS — O23599 Infection of other part of genital tract in pregnancy, unspecified trimester: Secondary | ICD-10-CM | POA: Diagnosis not present

## 2022-09-05 DIAGNOSIS — A5901 Trichomonal vulvovaginitis: Secondary | ICD-10-CM

## 2022-09-05 DIAGNOSIS — Z8619 Personal history of other infectious and parasitic diseases: Secondary | ICD-10-CM

## 2022-09-05 NOTE — Progress Notes (Signed)
SUBJECTIVE:  37 y.o. female who presents for TOC. Denies abnormal vaginal discharge, bleeding or significant pelvic pain. No UTI symptoms. History of known exposure to chlamydia and trichomonas.  No LMP recorded. (Menstrual status: IUD).  OBJECTIVE:  She appears well.   ASSESSMENT:  STI Screen   PLAN:  GC, chlamydia, and trichomonas probe sent to lab.  Treatment: To be determined once lab results are received.  Pt follow up as needed.

## 2022-09-06 LAB — CERVICOVAGINAL ANCILLARY ONLY
Chlamydia: NEGATIVE
Comment: NEGATIVE
Comment: NEGATIVE
Comment: NORMAL
Neisseria Gonorrhea: NEGATIVE
Trichomonas: POSITIVE — AB

## 2022-09-07 ENCOUNTER — Telehealth: Payer: Self-pay

## 2022-09-07 ENCOUNTER — Other Ambulatory Visit: Payer: Self-pay

## 2022-09-07 DIAGNOSIS — A599 Trichomoniasis, unspecified: Secondary | ICD-10-CM

## 2022-09-07 MED ORDER — METRONIDAZOLE 500 MG PO TABS
500.0000 mg | ORAL_TABLET | Freq: Two times a day (BID) | ORAL | 0 refills | Status: DC
Start: 2022-09-07 — End: 2024-02-14

## 2022-09-07 NOTE — Telephone Encounter (Signed)
I connected with  Dawn Mayo on 09/07/22 by telephone and verified that I am speaking with the correct person using two identifiers.  Patient advised of results and medication sent to pharmacy.   Patient informed to abstain from any unprotected intercourse until both parties have received appropriate treatment.   Patient scheduled for TOC.

## 2022-09-07 NOTE — Telephone Encounter (Signed)
Attempted to contact pt with results.Left voicemail asking pt to call office back.

## 2022-09-09 ENCOUNTER — Other Ambulatory Visit: Payer: Self-pay | Admitting: Obstetrics & Gynecology

## 2022-10-01 ENCOUNTER — Ambulatory Visit: Payer: Medicaid Other

## 2022-12-18 DIAGNOSIS — L218 Other seborrheic dermatitis: Secondary | ICD-10-CM | POA: Diagnosis not present

## 2022-12-26 ENCOUNTER — Encounter: Payer: BLUE CROSS/BLUE SHIELD | Admitting: Family Medicine

## 2023-08-13 DIAGNOSIS — R519 Headache, unspecified: Secondary | ICD-10-CM | POA: Diagnosis not present

## 2023-11-27 DIAGNOSIS — J029 Acute pharyngitis, unspecified: Secondary | ICD-10-CM | POA: Diagnosis not present

## 2023-11-27 DIAGNOSIS — Z758 Other problems related to medical facilities and other health care: Secondary | ICD-10-CM | POA: Diagnosis not present

## 2023-11-27 DIAGNOSIS — R0981 Nasal congestion: Secondary | ICD-10-CM | POA: Diagnosis not present

## 2023-11-27 DIAGNOSIS — K625 Hemorrhage of anus and rectum: Secondary | ICD-10-CM | POA: Diagnosis not present

## 2023-11-27 DIAGNOSIS — Z20822 Contact with and (suspected) exposure to covid-19: Secondary | ICD-10-CM | POA: Diagnosis not present

## 2023-11-28 ENCOUNTER — Encounter: Payer: Self-pay | Admitting: Family Medicine

## 2023-11-28 DIAGNOSIS — K921 Melena: Secondary | ICD-10-CM

## 2023-12-09 ENCOUNTER — Other Ambulatory Visit: Payer: Self-pay | Admitting: Family Medicine

## 2023-12-09 DIAGNOSIS — Z1231 Encounter for screening mammogram for malignant neoplasm of breast: Secondary | ICD-10-CM

## 2023-12-17 ENCOUNTER — Encounter

## 2023-12-17 ENCOUNTER — Ambulatory Visit
Admission: RE | Admit: 2023-12-17 | Discharge: 2023-12-17 | Disposition: A | Discharge status: Home / Self Care | Source: Ambulatory Visit | Attending: Family Medicine | Admitting: Family Medicine

## 2023-12-17 DIAGNOSIS — Z1231 Encounter for screening mammogram for malignant neoplasm of breast: Secondary | ICD-10-CM

## 2023-12-20 ENCOUNTER — Ambulatory Visit: Payer: Self-pay | Admitting: Family Medicine

## 2024-01-10 DIAGNOSIS — K625 Hemorrhage of anus and rectum: Secondary | ICD-10-CM | POA: Diagnosis not present

## 2024-01-21 ENCOUNTER — Telehealth: Payer: Self-pay

## 2024-01-21 NOTE — Telephone Encounter (Signed)
 Returned patient's call to schedule annual visit. Left voicemail for her to call back and schedule.

## 2024-02-11 DIAGNOSIS — L508 Other urticaria: Secondary | ICD-10-CM | POA: Diagnosis not present

## 2024-02-11 DIAGNOSIS — L700 Acne vulgaris: Secondary | ICD-10-CM | POA: Diagnosis not present

## 2024-02-12 ENCOUNTER — Telehealth: Payer: Self-pay

## 2024-02-12 NOTE — Telephone Encounter (Signed)
 Left voicemail for patient to call back and confirm her appointment.

## 2024-02-13 ENCOUNTER — Other Ambulatory Visit (HOSPITAL_COMMUNITY)
Admission: RE | Admit: 2024-02-13 | Discharge: 2024-02-13 | Disposition: A | Source: Ambulatory Visit | Attending: Family Medicine | Admitting: Family Medicine

## 2024-02-13 ENCOUNTER — Ambulatory Visit (INDEPENDENT_AMBULATORY_CARE_PROVIDER_SITE_OTHER): Admitting: Family Medicine

## 2024-02-13 VITALS — BP 120/78 | HR 73 | Ht 61.0 in | Wt 169.1 lb

## 2024-02-13 DIAGNOSIS — R5383 Other fatigue: Secondary | ICD-10-CM | POA: Diagnosis not present

## 2024-02-13 DIAGNOSIS — Z7721 Contact with and (suspected) exposure to potentially hazardous body fluids: Secondary | ICD-10-CM

## 2024-02-13 DIAGNOSIS — Z01419 Encounter for gynecological examination (general) (routine) without abnormal findings: Secondary | ICD-10-CM | POA: Insufficient documentation

## 2024-02-13 DIAGNOSIS — K921 Melena: Secondary | ICD-10-CM

## 2024-02-13 DIAGNOSIS — Z1231 Encounter for screening mammogram for malignant neoplasm of breast: Secondary | ICD-10-CM | POA: Diagnosis not present

## 2024-02-13 MED ORDER — ESTRADIOL 0.025 MG/24HR TD PTTW
1.0000 | MEDICATED_PATCH | TRANSDERMAL | 3 refills | Status: AC
Start: 2024-02-13 — End: ?

## 2024-02-13 MED ORDER — PROGESTERONE MICRONIZED 100 MG PO CAPS
100.0000 mg | ORAL_CAPSULE | Freq: Every day | ORAL | 11 refills | Status: AC
Start: 2024-02-13 — End: ?

## 2024-02-13 NOTE — Progress Notes (Signed)
 Patient presents for Annual.  LMP: No LMP recorded. (Menstrual status: IUD).  Last pap: 07/26/2022 abnormal  Contraception: IUD  Mammogram: Due, last mammogram: 12/17/2023 STD Screening: Accepts Flu Vaccine : Declines  CC:   Fun Fact:  blood in stool, tied often wants to discuss that, thinks her vitamin d  is low, a lot of cramping not just on her period, irregular periods and lasting a long time, perimenopause and talk about having another baby.   Gad 7 and psq9 done

## 2024-02-13 NOTE — Progress Notes (Signed)
 ANNUAL EXAM Patient name: Nidya Bouyer MRN [id number]  Date of birth: 12/23/1985 Chief Complaint:   Annual Exam  History of Present Illness:   Evelyna Folker is a 38 y.o.  [phone]  female  being seen today for a routine annual exam.  Current complaints:  Discussed the use of AI scribe software for clinical note transcription with the patient, who gave verbal consent to proceed.  History of Present Illness Florencia Zaccaro is a 38 year old female who presents with concerns about perimenopause and IUD management.  She is considering having another child and is concerned about her age and the possibility of being in perimenopause. She has had an IUD for five years and is unsure whether she is experiencing perimenopausal symptoms.  She feels unusually warm, although she is typically cold, and has noticed this change when others around her feel cold. No traditional hot flashes, night sweats, or sleep disturbances are present, except when her children wake her. Her mood is generally stable, though she sometimes feels overstimulated and 'touched out'.  Her menstrual cycle has changed since the IUD insertion; initially, she had minimal to no periods, but now experiences longer and heavier periods. She also reports cramping that is not necessarily linked to her menstrual cycle. No vaginal dryness is reported.  She is unsure about her family history of menopause as her mother had a hysterectomy and her grandmother did not experience hot flashes before passing away.  She is currently taking Allegra twice daily for hives on her back, which were diagnosed by a dermatologist. She is also concerned about her weight, feeling that she is the heaviest she has ever been, and experiences fatigue, which she attributes to her busy life with five-year-old twins, two jobs, and a husband.    No LMP recorded. (Menstrual status: IUD).    Last pap 2024. Results were: NILM w/ HRHPV negative. H/O abnormal pap: no Last  mammogram: 12/2023. Results were: normal. Family h/o breast cancer: no Last colonoscopy: pending     02/13/2024   11:48 AM 08/22/2022   10:45 AM 07/26/2022    2:51 PM  Depression screen PHQ 2/9  Decreased Interest 0 1 1  Down, Depressed, Hopeless 0 0 0  PHQ - 2 Score 0 1 1  Altered sleeping 0 1 0  Tired, decreased energy 2 2 2   Change in appetite 0 0 1  Feeling bad or failure about yourself  0 0 0  Trouble concentrating 0 0 1  Moving slowly or fidgety/restless 0 0 0  Suicidal thoughts 0 0 0  PHQ-9 Score 2 4  5    Difficult doing work/chores  Somewhat difficult      Data saved with a previous flowsheet row definition        02/13/2024   11:48 AM 08/22/2022   10:46 AM 07/26/2022    2:52 PM  GAD 7 : Generalized Anxiety Score  Nervous, Anxious, on Edge 0 0 1  Control/stop worrying 1 0 1  Worry too much - different things 1 0 0  Trouble relaxing 0 0 0  Restless 0 0 0  Easily annoyed or irritable 1 0 0  Afraid - awful might happen 0 0 1  Total GAD 7 Score 3 0 3  Anxiety Difficulty  Not difficult at all      Review of Systems:   Pertinent items are noted in HPI Denies any headaches, blurred vision, fatigue, shortness of breath, chest pain, abdominal pain, abnormal vaginal discharge/itching/odor/irritation, problems with  periods, bowel movements, urination, or intercourse unless otherwise stated above. Pertinent History Reviewed:  Reviewed past medical,surgical, social and family history.  Reviewed problem list, medications and allergies. Physical Assessment:   Vitals:   02/13/24 1140  BP: 120/78  Pulse: 73  Weight: 169 lb 1.9 oz (76.7 kg)  Height: 5' 1 (1.549 m)  Body mass index is 31.95 kg/m.        Physical Examination:   General appearance - well appearing, and in no distress  Mental status - alert, oriented to person, place, and time  Psych:  She has a normal mood and affect  Skin - warm and dry, normal color, no suspicious lesions noted  Chest - effort  normal, all lung fields clear to auscultation bilaterally  Heart - normal rate and regular rhythm  Neck:  midline trachea, no thyromegaly or nodules  Breasts - breasts appear normal, no suspicious masses, no skin or nipple changes or axillary nodes  Abdomen - soft, nontender, nondistended, no masses or organomegaly  Pelvic - VULVA: normal appearing vulva with no masses, tenderness or lesions  VAGINA: normal appearing vagina with normal color and discharge, no lesions  CERVIX: normal appearing cervix without discharge or lesions, no CMT  Thin prep pap is done with HR HPV cotesting  UTERUS: uterus is felt to be normal size, shape, consistency and nontender   ADNEXA: No adnexal masses or tenderness noted.  Extremities:  No swelling or varicosities noted  Chaperone present for exam  Assessment & Plan:  1. Encounter for well woman exam with routine gynecological exam (Primary) - Cytology - PAP  2. Exposure to body fluid - Cervicovaginal ancillary only( ) - RPR+HBsAg+HCVAb+...  3. Screening mammogram for breast cancer - MM 3D SCREENING MAMMOGRAM BILATERAL BREAST; Future  4. Blood in stool Referred for colonoscopy - CT VIRTUAL COLONOSCOPY DIAGNOSTIC; Future  5. Fatigue, unspecified type Perimenopausal symptoms with abnormal uterine bleeding and fatigue Perimenopausal symptoms include hot flashes, mood changes, and abnormal uterine bleeding. Fatigue likely related to perimenopause. HRT considered for symptom relief and health benefits. Risks include breast cancer and thromboembolism. - Continue IUD until pregnancy decision is made. - Initiate low dose oral progesterone at night to regulate bleeding and protect endometrium. - Consider low dose estrogen patch for symptom relief. - Ordered blood work: thyroid  levels, blood counts, vitamin D . - VITAMIN D  25 Hydroxy (Vit-D Deficiency, Fractures) - CBC - Comp Met (CMET) - TSH Rfx on Abnormal to Free T4  Labs/procedures today:   Orders Placed This Encounter  Procedures   MM 3D SCREENING MAMMOGRAM BILATERAL BREAST   CT VIRTUAL COLONOSCOPY DIAGNOSTIC   RPR+HBsAg+HCVAb+...   VITAMIN D  25 Hydroxy (Vit-D Deficiency, Fractures)   CBC   Comp Met (CMET)   TSH Rfx on Abnormal to Free T4    Meds: No orders of the defined types were placed in this encounter.   Follow-up: No follow-ups on file.  Ayden Hardwick J Roxane Puerto, DO 02/13/2024 12:38 PM

## 2024-02-14 ENCOUNTER — Encounter: Payer: Self-pay | Admitting: Family Medicine

## 2024-02-14 ENCOUNTER — Ambulatory Visit: Payer: Self-pay | Admitting: Family Medicine

## 2024-02-14 DIAGNOSIS — A599 Trichomoniasis, unspecified: Secondary | ICD-10-CM

## 2024-02-14 LAB — RPR+HBSAG+HCVAB+...
HIV Screen 4th Generation wRfx: NONREACTIVE
Hep C Virus Ab: NONREACTIVE
Hepatitis B Surface Ag: NEGATIVE
RPR Ser Ql: NONREACTIVE

## 2024-02-14 LAB — CBC
Hematocrit: 40.5 % (ref 34.0–46.6)
Hemoglobin: 12.9 g/dL (ref 11.1–15.9)
MCH: 28.3 pg (ref 26.6–33.0)
MCHC: 31.9 g/dL (ref 31.5–35.7)
MCV: 89 fL (ref 79–97)
Platelets: 380 x10E3/uL (ref 150–450)
RBC: 4.56 x10E6/uL (ref 3.77–5.28)
RDW: 12.7 % (ref 11.7–15.4)
WBC: 9.8 x10E3/uL (ref 3.4–10.8)

## 2024-02-14 LAB — CERVICOVAGINAL ANCILLARY ONLY
Bacterial Vaginitis (gardnerella): POSITIVE — AB
Candida Glabrata: NEGATIVE
Candida Vaginitis: NEGATIVE
Chlamydia: NEGATIVE
Comment: NEGATIVE
Comment: NEGATIVE
Comment: NEGATIVE
Comment: NEGATIVE
Comment: NEGATIVE
Comment: NORMAL
Neisseria Gonorrhea: NEGATIVE
Trichomonas: NEGATIVE

## 2024-02-14 LAB — COMPREHENSIVE METABOLIC PANEL WITH GFR
ALT: 13 IU/L (ref 0–32)
AST: 18 IU/L (ref 0–40)
Albumin: 4.6 g/dL (ref 3.9–4.9)
Alkaline Phosphatase: 40 IU/L — ABNORMAL LOW (ref 41–116)
BUN/Creatinine Ratio: 11 (ref 9–23)
BUN: 9 mg/dL (ref 6–24)
Bilirubin Total: 0.4 mg/dL (ref 0.0–1.2)
CO2: 20 mmol/L (ref 20–29)
Calcium: 9.3 mg/dL (ref 8.7–10.2)
Chloride: 100 mmol/L (ref 96–106)
Creatinine, Ser: 0.79 mg/dL (ref 0.57–1.00)
Globulin, Total: 3.3 g/dL (ref 1.5–4.5)
Glucose: 67 mg/dL — ABNORMAL LOW (ref 70–99)
Potassium: 4.1 mmol/L (ref 3.5–5.2)
Sodium: 136 mmol/L (ref 134–144)
Total Protein: 7.9 g/dL (ref 6.0–8.5)
eGFR: 97 mL/min/1.73 (ref >59)

## 2024-02-14 LAB — TSH RFX ON ABNORMAL TO FREE T4: TSH: 3.3 u[IU]/mL (ref 0.450–4.500)

## 2024-02-14 MED ORDER — METRONIDAZOLE 500 MG PO TABS: 500.0000 mg | ORAL_TABLET | Freq: Two times a day (BID) | ORAL | 0 refills | Status: AC

## 2024-02-17 ENCOUNTER — Other Ambulatory Visit: Payer: Self-pay

## 2024-02-17 ENCOUNTER — Telehealth: Payer: Self-pay

## 2024-02-17 LAB — CYTOLOGY - PAP
Comment: NEGATIVE
Diagnosis: NEGATIVE
High risk HPV: NEGATIVE

## 2024-02-17 NOTE — Telephone Encounter (Signed)
 Called [facility] to add Hemoglobin A1c and vitamin D . Vitamin D  was ordered under [facility] and test was unable to be performed. Per [facility] rep, both labs will be added.  Dawn Mayo l Geovana Gebel, CMA

## 2024-02-17 NOTE — Progress Notes (Signed)
 Error

## 2024-02-18 LAB — HGB A1C W/O EAG: Hgb A1c MFr Bld: 5.6 % (ref 4.8–5.6)

## 2024-02-18 LAB — SPECIMEN STATUS REPORT

## 2024-02-18 LAB — VITAMIN D 25 HYDROXY (VIT D DEFICIENCY, FRACTURES): Vit D, 25-Hydroxy: 12.4 ng/mL — ABNORMAL LOW (ref 30.0–100.0)

## 2024-02-18 MED ORDER — FENTANYL CITRATE (PF) 50 MCG/ML IJ SOSY
PREFILLED_SYRINGE | INTRAMUSCULAR | Status: AC
  Filled 2024-02-18: qty 1

## 2024-02-18 MED ORDER — DIPHENHYDRAMINE HCL 50 MG/ML IJ SOLN
INTRAMUSCULAR | Status: AC
  Filled 2024-02-18: qty 1

## 2024-02-21 MED ORDER — VITAMIN D (ERGOCALCIFEROL) 1.25 MG (50000 UNIT) PO CAPS: 50000.0000 [IU] | ORAL_CAPSULE | ORAL | 1 refills | Status: AC
